# Patient Record
Sex: Male | Born: 1990 | Hispanic: Yes | Marital: Single | Smoking: Never smoker
Health system: Southern US, Community
[De-identification: ages and names within clinical notes are randomized; demographics above are authoritative.]

## PROBLEM LIST (undated history)

## (undated) ENCOUNTER — Ambulatory Visit: Admission: EM | Payer: Self-pay | Source: Home / Self Care

## (undated) DIAGNOSIS — E119 Type 2 diabetes mellitus without complications: Secondary | ICD-10-CM

## (undated) HISTORY — DX: Type 2 diabetes mellitus without complications: E11.9

---
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
## 2013-03-08 ENCOUNTER — Encounter (HOSPITAL_COMMUNITY)
Admission: EM | Disposition: A | Discharge status: Home / Self Care | Payer: Self-pay | Source: Home / Self Care | Attending: Emergency Medicine

## 2013-03-08 ENCOUNTER — Observation Stay (HOSPITAL_BASED_OUTPATIENT_CLINIC_OR_DEPARTMENT_OTHER)
Admission: EM | Admit: 2013-03-08 | Discharge: 2013-03-10 | Disposition: A | Discharge status: Home / Self Care | Payer: Self-pay | Attending: General Surgery | Admitting: General Surgery

## 2013-03-08 ENCOUNTER — Emergency Department (HOSPITAL_BASED_OUTPATIENT_CLINIC_OR_DEPARTMENT_OTHER): Payer: Self-pay

## 2013-03-08 ENCOUNTER — Encounter (HOSPITAL_BASED_OUTPATIENT_CLINIC_OR_DEPARTMENT_OTHER): Payer: Self-pay | Admitting: *Deleted

## 2013-03-08 DIAGNOSIS — K3580 Unspecified acute appendicitis: Secondary | ICD-10-CM

## 2013-03-08 DIAGNOSIS — D72829 Elevated white blood cell count, unspecified: Secondary | ICD-10-CM | POA: Insufficient documentation

## 2013-03-08 DIAGNOSIS — R1031 Right lower quadrant pain: Secondary | ICD-10-CM | POA: Insufficient documentation

## 2013-03-08 HISTORY — PX: LAPAROSCOPIC APPENDECTOMY: SHX408

## 2013-03-08 LAB — URINALYSIS, ROUTINE W REFLEX MICROSCOPIC
Ketones, ur: >80 mg/dL — AB
Leukocytes, UA: NEGATIVE
Nitrite: NEGATIVE
Protein, ur: 30 mg/dL — AB
Urobilinogen, UA: 0.2 mg/dL (ref 0.0–1.0)
pH: 8.5 — ABNORMAL HIGH (ref 5.0–8.0)

## 2013-03-08 LAB — COMPREHENSIVE METABOLIC PANEL
AST: 15 U/L (ref 0–37)
Albumin: 4.8 g/dL (ref 3.5–5.2)
Alkaline Phosphatase: 74 U/L (ref 39–117)
Chloride: 100 mEq/L (ref 96–112)
Potassium: 3.6 mEq/L (ref 3.5–5.1)
Total Bilirubin: 0.8 mg/dL (ref 0.3–1.2)
Total Protein: 8 g/dL (ref 6.0–8.3)

## 2013-03-08 LAB — CBC WITH DIFFERENTIAL/PLATELET
Basophils Absolute: 0 10*3/uL (ref 0.0–0.1)
Basophils Relative: 0 % (ref 0–1)
MCHC: 35 g/dL (ref 30.0–36.0)
Neutro Abs: 18 10*3/uL — ABNORMAL HIGH (ref 1.7–7.7)
Neutrophils Relative %: 87 % — ABNORMAL HIGH (ref 43–77)
Platelets: 305 10*3/uL (ref 150–400)
RDW: 13.5 % (ref 11.5–15.5)

## 2013-03-08 LAB — URINE MICROSCOPIC-ADD ON

## 2013-03-08 SURGERY — APPENDECTOMY, LAPAROSCOPIC
Anesthesia: General | Wound class: Contaminated

## 2013-03-08 MED ORDER — MORPHINE SULFATE 4 MG/ML IJ SOLN
4.0000 mg | Freq: Once | INTRAMUSCULAR | Status: AC
  Administered 2013-03-08: 4 mg via INTRAVENOUS
  Filled 2013-03-08: qty 1

## 2013-03-08 MED ORDER — SODIUM CHLORIDE 0.9 % IV SOLN
1.0000 g | Freq: Once | INTRAVENOUS | Status: AC
  Administered 2013-03-08: 1 g via INTRAVENOUS
  Filled 2013-03-08: qty 1

## 2013-03-08 MED ORDER — ONDANSETRON HCL 4 MG/2ML IJ SOLN
4.0000 mg | Freq: Once | INTRAMUSCULAR | Status: AC
  Administered 2013-03-08: 4 mg via INTRAVENOUS
  Filled 2013-03-08: qty 2

## 2013-03-08 MED ORDER — MORPHINE SULFATE 4 MG/ML IJ SOLN: 4.0000 mg | INTRAMUSCULAR | Status: DC | PRN

## 2013-03-08 MED ORDER — IOHEXOL 300 MG/ML  SOLN
100.0000 mL | Freq: Once | INTRAMUSCULAR | Status: AC | PRN
  Administered 2013-03-08: 100 mL via INTRAVENOUS

## 2013-03-08 MED ORDER — SODIUM CHLORIDE 0.9 % IV SOLN
INTRAVENOUS | Status: AC
  Filled 2013-03-08: qty 50

## 2013-03-08 MED ORDER — LACTATED RINGERS IV SOLN
INTRAVENOUS | Status: DC
  Administered 2013-03-08 – 2013-03-09 (×2): via INTRAVENOUS

## 2013-03-08 MED ORDER — IOHEXOL 300 MG/ML  SOLN
50.0000 mL | Freq: Once | INTRAMUSCULAR | Status: AC | PRN
  Administered 2013-03-08: 50 mL via ORAL

## 2013-03-08 SURGICAL SUPPLY — 40 items
APPLIER CLIP ROT 10 11.4 M/L (STAPLE)
CANISTER SUCTION 2500CC (MISCELLANEOUS) IMPLANT
CHLORAPREP W/TINT 26ML (MISCELLANEOUS) ×2 IMPLANT
CLIP APPLIE ROT 10 11.4 M/L (STAPLE) IMPLANT
CLOTH BEACON ORANGE TIMEOUT ST (SAFETY) ×2 IMPLANT
COVER SURGICAL LIGHT HANDLE (MISCELLANEOUS) ×2 IMPLANT
CUTTER FLEX LINEAR 45M (STAPLE) ×2 IMPLANT
DECANTER SPIKE VIAL GLASS SM (MISCELLANEOUS) ×6 IMPLANT
DERMABOND ADVANCED (GAUZE/BANDAGES/DRESSINGS) ×1
DERMABOND ADVANCED .7 DNX12 (GAUZE/BANDAGES/DRESSINGS) ×1 IMPLANT
ELECT REM PT RETURN 9FT ADLT (ELECTROSURGICAL) ×2
ELECTRODE REM PT RTRN 9FT ADLT (ELECTROSURGICAL) ×1 IMPLANT
GLOVE SURG SS PI 7.5 STRL IVOR (GLOVE) ×4 IMPLANT
GLOVE SURG SS PI 8.5 STRL IVOR (GLOVE) ×2
GLOVE SURG SS PI 8.5 STRL STRW (GLOVE) ×2 IMPLANT
GOWN STRL NON-REIN LRG LVL3 (GOWN DISPOSABLE) ×4 IMPLANT
GOWN STRL REIN 3XL XLG LVL4 (GOWN DISPOSABLE) ×2 IMPLANT
GOWN STRL REIN XL XLG (GOWN DISPOSABLE) ×2 IMPLANT
HANDLE STAPLE EGIA 4 XL (STAPLE) ×2 IMPLANT
HANDLE STAPLE ENDO GIA SHORT (STAPLE) ×1
KIT BASIN OR (CUSTOM PROCEDURE TRAY) ×2 IMPLANT
NS IRRIG 1000ML POUR BTL (IV SOLUTION) ×2 IMPLANT
POUCH SPECIMEN RETRIEVAL 10MM (ENDOMECHANICALS) ×2 IMPLANT
RELOAD 45 VASCULAR/THIN (ENDOMECHANICALS) IMPLANT
RELOAD EGIA 45 MED/THCK PURPLE (STAPLE) ×2 IMPLANT
RELOAD EGIA 45 TAN VASC (STAPLE) ×2 IMPLANT
RELOAD STAPLE TA45 3.5 REG BLU (ENDOMECHANICALS) IMPLANT
SCALPEL HARMONIC ACE (MISCELLANEOUS) ×2 IMPLANT
SCISSORS LAP 5X35 DISP (ENDOMECHANICALS) IMPLANT
SET IRRIG TUBING LAPAROSCOPIC (IRRIGATION / IRRIGATOR) ×2 IMPLANT
SLEEVE ENDOPATH XCEL 5M (ENDOMECHANICALS) ×2 IMPLANT
STAPLER ENDO GIA 12MM SHORT (STAPLE) ×1 IMPLANT
SUT MNCRL AB 4-0 PS2 18 (SUTURE) ×2 IMPLANT
SUT VICRYL 0 UR6 27IN ABS (SUTURE) ×2 IMPLANT
TOWEL OR 17X24 6PK STRL BLUE (TOWEL DISPOSABLE) ×2 IMPLANT
TOWEL OR 17X26 10 PK STRL BLUE (TOWEL DISPOSABLE) ×2 IMPLANT
TRAY FOLEY CATH 14FR (SET/KITS/TRAYS/PACK) ×2 IMPLANT
TROCAR XCEL BLUNT TIP 100MML (ENDOMECHANICALS) ×2 IMPLANT
TROCAR XCEL NON-BLD 5MMX100MML (ENDOMECHANICALS) ×2 IMPLANT
WATER STERILE IRR 1000ML POUR (IV SOLUTION) IMPLANT

## 2013-03-08 NOTE — ED Notes (Signed)
Pt states he was awakened at 0500 with RLQ pain. +vomiting. Rebound tenderness. Temp 99.2 at triage.

## 2013-03-08 NOTE — H&P (Signed)
Reason for Consult:appendicitis Referring Physician: Dr. [name] is an 22 y.o. male.  HPI: He awoke this am with periumbilical pain which has progressed throughout the day and has now localized to the RLQ.  He has had some nausea and 2 episodes of vomiting.  +chills but no fevers.  He denies any abnormalities with his urine or bowels.  He was transferred [facility] for appendectomy  History reviewed. No pertinent past medical history.  History reviewed. No pertinent past surgical history.  History reviewed. No pertinent family history.  Social History:  reports that he has never smoked. He does not have any smokeless tobacco history on file. He reports that he does not drink alcohol or use illicit drugs.  Allergies: No Known Allergies  Medications: I have reviewed the patient's current medications.  Results for orders placed during the hospital encounter of 03/08/13 (from the past 48 hour(s))  URINALYSIS, ROUTINE W REFLEX MICROSCOPIC     Status: Abnormal   Collection Time    03/08/13  4:16 PM      Result Value Range   Color, Urine YELLOW  YELLOW   APPearance CLOUDY (*) CLEAR   Specific Gravity, Urine 1.028  1.005 - 1.030   pH 8.5 (*) 5.0 - 8.0   Glucose, UA NEGATIVE  NEGATIVE mg/dL   Hgb urine dipstick NEGATIVE  NEGATIVE   Bilirubin Urine NEGATIVE  NEGATIVE   Ketones, ur >80 (*) NEGATIVE mg/dL   Protein, ur 30 (*) NEGATIVE mg/dL   Urobilinogen, UA 0.2  0.0 - 1.0 mg/dL   Nitrite NEGATIVE  NEGATIVE   Leukocytes, UA NEGATIVE  NEGATIVE  URINE MICROSCOPIC-ADD ON     Status: None   Collection Time    03/08/13  4:16 PM      Result Value Range   Squamous Epithelial / LPF RARE  RARE   WBC, UA 0-2  <3 WBC/hpf   RBC / HPF 0-2  <3 RBC/hpf   Bacteria, UA RARE  RARE   Urine-Other MUCOUS PRESENT     Comment: AMORPHOUS URATES/PHOSPHATES  CBC WITH DIFFERENTIAL     Status: Abnormal   Collection Time    03/08/13  4:34 PM      Result Value Range   WBC 20.7 (*) 4.0 - 10.5 K/uL   RBC 5.49  4.22 - 5.81 MIL/uL   Hemoglobin 15.8  13.0 - 17.0 g/dL   HCT 19.1  47.8 - 29.5 %   MCV 82.3  78.0 - 100.0 fL   MCH 28.8  26.0 - 34.0 pg   MCHC 35.0  30.0 - 36.0 g/dL   RDW 62.1  30.8 - 65.7 %   Platelets 305  150 - 400 K/uL   Neutrophils Relative 87 (*) 43 - 77 %   Neutro Abs 18.0 (*) 1.7 - 7.7 K/uL   Lymphocytes Relative 4 (*) 12 - 46 %   Lymphs Abs 0.8  0.7 - 4.0 K/uL   Monocytes Relative 9  3 - 12 %   Monocytes Absolute 1.9 (*) 0.1 - 1.0 K/uL   Eosinophils Relative 0  0 - 5 %   Eosinophils Absolute 0.0  0.0 - 0.7 K/uL   Basophils Relative 0  0 - 1 %   Basophils Absolute 0.0  0.0 - 0.1 K/uL  COMPREHENSIVE METABOLIC PANEL     Status: Abnormal   Collection Time    03/08/13  4:34 PM      Result Value Range   Sodium 140  135 - 145 mEq/L   Potassium 3.6  3.5 - 5.1 mEq/L   Chloride 100  96 - 112 mEq/L   CO2 26  19 - 32 mEq/L   Glucose, Bld 111 (*) 70 - 99 mg/dL   BUN 9  6 - 23 mg/dL   Creatinine, Ser 1.61  0.50 - 1.35 mg/dL   Calcium 09.6  8.4 - 04.5 mg/dL   Total Protein 8.0  6.0 - 8.3 g/dL   Albumin 4.8  3.5 - 5.2 g/dL   AST 15  0 - 37 U/L   ALT 14  0 - 53 U/L   Alkaline Phosphatase 74  39 - 117 U/L   Total Bilirubin 0.8  0.3 - 1.2 mg/dL   GFR calc non Af Amer >90  >90 mL/min   GFR calc Af Amer >90  >90 mL/min   Comment:            The eGFR has been calculated     using the CKD EPI equation.     This calculation has not been     validated in all clinical     situations.     eGFR's persistently     <90 mL/min signify     possible Chronic Kidney Disease.    Ct Abdomen Pelvis W Contrast  03/08/2013  *RADIOLOGY REPORT*  Clinical Data: Right lower quadrant pain, rebound tenderness, vomiting  CT ABDOMEN AND PELVIS WITH CONTRAST  Technique:  Multidetector CT imaging of the abdomen and pelvis was performed following the standard protocol during bolus administration of intravenous contrast.  Contrast: 50mL OMNIPAQUE IOHEXOL 300 MG/ML   SOLN, OMNIPAQUE IOHEXOL 300 MG/ML  SOLN  Comparison: None.  Findings: Lung bases are unremarkable.  Sagittal images of the spine are unremarkable.  Liver, spleen, pancreas and adrenals are unremarkable.  Kidneys are symmetrical in size and enhancement.  No hydronephrosis or hydroureter.  No small bowel obstruction.  No ascites or free air. No adenopathy.  There is abnormal distention of the appendix with fluid in the right lower quadrant.  This is best visualized in axial image 52. A calcified appendicolith is noted at the base of the appendix measures about 6 mm.  The appendix measures 1.2 cm in diameter with mild enhancement of the wall.  Findings are consistent with acute appendicitis.  The appendix is best visualized in the sagittal image 31.  Prostate gland seminal vesicles are unremarkable.  Urinary bladder is unremarkable.  IMPRESSION:  1.  Abnormal enlargement of the appendix with fluid and mild enhancement of the wall.  There is a calcified appendicolith at the appendix base.  Findings are consistent with acute appendicitis. 2.  No hydronephrosis or hydroureter. 3.  No small bowel obstruction.  Critical findings discussed with Dr. Judd Love.   Original Report Authenticated By: Natasha Mead, M.D.     All other review of systems negative or noncontributory except as stated in the HPI   Blood pressure 129/69, pulse 96, temperature 100.5 F (38.1 C), temperature source Oral, resp. rate 20, height 5\' 8"  (1.727 m), weight 174 lb (78.926 kg), SpO2 98.00%. General appearance: alert, cooperative and no distress Head: Normocephalic, without obvious abnormality, atraumatic Neck: no JVD and supple, symmetrical, trachea midline Resp: clear to auscultation bilaterally Cardio: normal rate, regular GI: soft, focal RLQ tenderness, ND, no other tenderness or peritoneal signs Extremities: extremities normal, atraumatic, no cyanosis or edema Pulses: 2+ and symmetric Skin: Skin color, texture, turgor normal. No  rashes or lesions  Assessment/Plan:  Appendicitis He has a history and exam which is very typical for acute appendicitis.  He has a CT scan concerning for this with elevated WBC and I have recommended appendectomy.  I discussed with him the risks of infection, bleeding, pain, scarring, leak, injury to surrounding structures, abscess, and need for open surgery or negative laparoscopy discussed and he would like to proceed with dx. Laparoscopy and appendectomy.  Cory Love 03/08/2013, 10:31 PM

## 2013-03-08 NOTE — Anesthesia Preprocedure Evaluation (Addendum)
Anesthesia Evaluation  Patient identified by MRN, date of birth, ID band Patient awake    Reviewed: Allergy & Precautions, H&P , NPO status , Patient's Chart, lab work & pertinent test results  Airway Mallampati: II TM Distance: >3 FB Neck ROM: full    Dental no notable dental hx. (+) Teeth Intact and Dental Advisory Given   Pulmonary neg pulmonary ROS,  breath sounds clear to auscultation  Pulmonary exam normal       Cardiovascular Exercise Tolerance: Good negative cardio ROS  Rhythm:regular Rate:Normal     Neuro/Psych negative neurological ROS  negative psych ROS   GI/Hepatic negative GI ROS, Neg liver ROS,   Endo/Other  negative endocrine ROS  Renal/GU negative Renal ROS  negative genitourinary   Musculoskeletal   Abdominal   Peds  Hematology negative hematology ROS (+)   Anesthesia Other Findings   Reproductive/Obstetrics negative OB ROS                           Anesthesia Physical Anesthesia Plan  ASA: I and emergent  Anesthesia Plan: General   Post-op Pain Management:    Induction: Intravenous, Rapid sequence and Cricoid pressure planned  Airway Management Planned: Oral ETT  Additional Equipment:   Intra-op Plan:   Post-operative Plan: Extubation in OR  Informed Consent: I have reviewed the patients History and Physical, chart, labs and discussed the procedure including the risks, benefits and alternatives for the proposed anesthesia with the patient or authorized representative who has indicated his/her understanding and acceptance.   Dental Advisory Given  Plan Discussed with: CRNA and Surgeon  Anesthesia Plan Comments:         Anesthesia Quick Evaluation  

## 2013-03-08 NOTE — ED Notes (Signed)
[facility] at bedside preparing pt for transport ?

## 2013-03-08 NOTE — ED Notes (Signed)
[id number]<GM> Expected date:<BR> Expected time:<BR> Means of arrival:<BR> Comments:<BR> Nivaan Dicenzo from [facility]

## 2013-03-08 NOTE — ED Provider Notes (Signed)
History    This chart was scribed for Cory Lyons, MD scribed by Magnus Sinning. The patient was seen in room MH07/MH07 at 16:47   CSN: [id number]  Arrival date & time 03/08/13  1559     Chief Complaint  Patient presents with  . Abdominal Pain    (Consider location/radiation/quality/duration/timing/severity/associated sxs/prior treatment) Patient is a 22 y.o. male presenting with abdominal pain. The history is provided by the patient. No language interpreter was used.  Abdominal Pain Pain location:  RLQ Pain radiates to:  Does not radiate Pain severity:  Moderate Duration:  12 hours Timing:  Constant Progression:  Worsening Chronicity:  New Context: awakening from sleep   Associated symptoms: fever and vomiting    Cory Love is a 22 y.o. male who presents to the Emergency Department complaining of constant moderate RLQ pain with associated emesis and fever that woke him up today approximately 12 hours ago. He says he went somewhere for eval, but says the abd pain improved and thus he left. He says the pain returned and it began worsening throughout day. Patient states he is healthy otherwise and denies any dysuria. However, he notes that  when he moves he notices that the back of his testes hurt mildly.  History reviewed. No pertinent past medical history.  History reviewed. No pertinent past surgical history.  History reviewed. No pertinent family history.  History  Substance Use Topics  . Smoking status: Never Smoker   . Smokeless tobacco: Not on file  . Alcohol Use: No      Review of Systems  Constitutional: Positive for fever.  Gastrointestinal: Positive for vomiting and abdominal pain.  All other systems reviewed and are negative.    Allergies  Review of patient's allergies indicates no known allergies.  Home Medications  No current outpatient prescriptions on file.  BP 119/72  Pulse 86  Temp(Src) 99.2 F (37.3 C) (Oral)  Resp 18  Ht 5'  8" (1.727 m)  Wt 174 lb (78.926 kg)  BMI 26.46 kg/m2  SpO2 100%  Physical Exam  Nursing note and vitals reviewed. Constitutional: He is oriented to person, place, and time. He appears well-developed and well-nourished. No distress.  HENT:  Head: Normocephalic and atraumatic.  Eyes: Conjunctivae and EOM are normal.  Neck: Neck supple. No tracheal deviation present.  Cardiovascular: Normal rate and regular rhythm.   No murmur heard. Pulmonary/Chest: Effort normal. No respiratory distress. He has no wheezes. He has no rales.  Abdominal: Soft. Bowel sounds are normal. He exhibits no distension. There is tenderness. There is no rebound and no guarding.  There is TTP in the RLQ with no rebound or guarding.   Musculoskeletal: Normal range of motion.  Neurological: He is alert and oriented to person, place, and time. No sensory deficit.  Skin: Skin is dry.  Psychiatric: He has a normal mood and affect. His behavior is normal.    ED Course  Procedures (including critical care time) DIAGNOSTIC STUDIES: Oxygen Saturation is 100% on room air, normal by my interpretation.    COORDINATION OF CARE: 16:48: Physical exam performed.   Labs Reviewed  URINALYSIS, ROUTINE W REFLEX MICROSCOPIC - Abnormal; Notable for the following:    APPearance CLOUDY (*)    pH 8.5 (*)    Ketones, ur >80 (*)    Protein, ur 30 (*)    All other components within normal limits  CBC WITH DIFFERENTIAL - Abnormal; Notable for the following:    WBC 20.7 (*)  Neutrophils Relative 87 (*)    Neutro Abs 18.0 (*)    Lymphocytes Relative 4 (*)    Monocytes Absolute 1.9 (*)    All other components within normal limits  COMPREHENSIVE METABOLIC PANEL - Abnormal; Notable for the following:    Glucose, Bld 111 (*)    All other components within normal limits  URINE MICROSCOPIC-ADD ON   No results found.   No diagnosis found.    MDM  The wbc is 21k, the ct confirms acute appendicitis.  I have ordered invanz and  have spoken with Dr. Biagio Quint at [facility] who agrees to accept the patient in transfer.  Pain medications offered, he has declined at this point.      .I personally performed the services described in this documentation, which was scribed in my presence. The recorded information has been reviewed and is accurate.           Cory Lyons, MD 03/08/13 [phone]

## 2013-03-09 ENCOUNTER — Encounter (HOSPITAL_COMMUNITY): Payer: Self-pay | Admitting: Anesthesiology

## 2013-03-09 ENCOUNTER — Emergency Department (HOSPITAL_COMMUNITY): Payer: Self-pay | Admitting: Anesthesiology

## 2013-03-09 MED ORDER — FENTANYL CITRATE 0.05 MG/ML IJ SOLN
INTRAMUSCULAR | Status: DC | PRN
  Administered 2013-03-09 (×2): 100 ug via INTRAVENOUS
  Administered 2013-03-09: 50 ug via INTRAVENOUS

## 2013-03-09 MED ORDER — BUPIVACAINE HCL 0.25 % IJ SOLN
INTRAMUSCULAR | Status: DC | PRN
  Administered 2013-03-09: 30 mL

## 2013-03-09 MED ORDER — ACETAMINOPHEN 10 MG/ML IV SOLN
INTRAVENOUS | Status: DC | PRN
  Administered 2013-03-09: 1000 mg via INTRAVENOUS

## 2013-03-09 MED ORDER — GLYCOPYRROLATE 0.2 MG/ML IJ SOLN
INTRAMUSCULAR | Status: DC | PRN
  Administered 2013-03-09: 0.6 mg via INTRAVENOUS

## 2013-03-09 MED ORDER — MORPHINE SULFATE 2 MG/ML IJ SOLN: 2.0000 mg | INTRAMUSCULAR | Status: DC | PRN

## 2013-03-09 MED ORDER — SODIUM CHLORIDE 0.9 % IV SOLN
1.0000 g | INTRAVENOUS | Status: AC
  Administered 2013-03-09: 1 g via INTRAVENOUS
  Filled 2013-03-09: qty 1

## 2013-03-09 MED ORDER — BUPIVACAINE HCL (PF) 0.25 % IJ SOLN
INTRAMUSCULAR | Status: AC
  Filled 2013-03-09: qty 30

## 2013-03-09 MED ORDER — LIDOCAINE-EPINEPHRINE 1 %-1:100000 IJ SOLN
INTRAMUSCULAR | Status: AC
  Filled 2013-03-09: qty 1

## 2013-03-09 MED ORDER — PROPOFOL 10 MG/ML IV BOLUS
INTRAVENOUS | Status: DC | PRN
  Administered 2013-03-09: 200 mg via INTRAVENOUS

## 2013-03-09 MED ORDER — ONDANSETRON HCL 4 MG/2ML IJ SOLN
INTRAMUSCULAR | Status: DC | PRN
  Administered 2013-03-09: 4 mg via INTRAVENOUS

## 2013-03-09 MED ORDER — ACETAMINOPHEN 10 MG/ML IV SOLN
INTRAVENOUS | Status: AC
  Filled 2013-03-09: qty 100

## 2013-03-09 MED ORDER — HYDROCODONE-ACETAMINOPHEN 5-325 MG PO TABS
1.0000 | ORAL_TABLET | ORAL | Status: DC | PRN
  Administered 2013-03-09: 1 via ORAL
  Filled 2013-03-09: qty 1

## 2013-03-09 MED ORDER — ROCURONIUM BROMIDE 100 MG/10ML IV SOLN
INTRAVENOUS | Status: DC | PRN
  Administered 2013-03-09: 30 mg via INTRAVENOUS
  Administered 2013-03-09: 10 mg via INTRAVENOUS

## 2013-03-09 MED ORDER — FENTANYL CITRATE 0.05 MG/ML IJ SOLN: 25.0000 ug | INTRAMUSCULAR | Status: DC | PRN

## 2013-03-09 MED ORDER — SUCCINYLCHOLINE CHLORIDE 20 MG/ML IJ SOLN
INTRAMUSCULAR | Status: DC | PRN
  Administered 2013-03-09: 140 mg via INTRAVENOUS

## 2013-03-09 MED ORDER — ONDANSETRON HCL 4 MG/2ML IJ SOLN: 4.0000 mg | Freq: Four times a day (QID) | INTRAMUSCULAR | Status: DC | PRN

## 2013-03-09 MED ORDER — ENOXAPARIN SODIUM 40 MG/0.4ML ~~LOC~~ SOLN
40.0000 mg | SUBCUTANEOUS | Status: DC
  Administered 2013-03-09: 40 mg via SUBCUTANEOUS
  Filled 2013-03-09 (×2): qty 0.4

## 2013-03-09 MED ORDER — LACTATED RINGERS IV SOLN
INTRAVENOUS | Status: DC
Start: 2013-03-09 — End: 2013-03-09

## 2013-03-09 MED ORDER — LIDOCAINE-EPINEPHRINE 1 %-1:100000 IJ SOLN
INTRAMUSCULAR | Status: DC | PRN
  Administered 2013-03-09: 30 mL

## 2013-03-09 MED ORDER — ONDANSETRON HCL 4 MG PO TABS: 4.0000 mg | ORAL_TABLET | Freq: Four times a day (QID) | ORAL | Status: DC | PRN

## 2013-03-09 MED ORDER — ACETAMINOPHEN 325 MG PO TABS: 650.0000 mg | ORAL_TABLET | Freq: Four times a day (QID) | ORAL | Status: DC | PRN

## 2013-03-09 MED ORDER — 0.9 % SODIUM CHLORIDE (POUR BTL) OPTIME
TOPICAL | Status: DC | PRN
  Administered 2013-03-09: 1000 mL

## 2013-03-09 MED ORDER — HYDROMORPHONE HCL PF 1 MG/ML IJ SOLN
INTRAMUSCULAR | Status: DC | PRN
  Administered 2013-03-09 (×2): 1 mg via INTRAVENOUS

## 2013-03-09 MED ORDER — LACTATED RINGERS IV SOLN
INTRAVENOUS | Status: DC
  Administered 2013-03-09 (×2): via INTRAVENOUS

## 2013-03-09 MED ORDER — NEOSTIGMINE METHYLSULFATE 1 MG/ML IJ SOLN
INTRAMUSCULAR | Status: DC | PRN
  Administered 2013-03-09: 4 mg via INTRAVENOUS

## 2013-03-09 NOTE — Care Management Utilization Note (Signed)
UR completed 

## 2013-03-09 NOTE — Transfer of Care (Signed)
Immediate Anesthesia Transfer of Care Note  Patient: Cory Love  Procedure(s) Performed: Procedure(s): APPENDECTOMY LAPAROSCOPIC (N/A)  Patient Location: PACU  Anesthesia Type:General  Level of Consciousness: awake, oriented, patient cooperative and responds to stimulation  Airway & Oxygen Therapy: Patient Spontanous Breathing and Patient connected to face mask oxygen  Post-op Assessment: Report given to PACU RN and Post -op Vital signs reviewed and stable  Post vital signs: Reviewed and stable  Complications: No apparent anesthesia complications

## 2013-03-09 NOTE — Brief Op Note (Signed)
03/08/2013 - 03/09/2013  2:30 AM  PATIENT:  Cory Love  22 y.o. male  PRE-OPERATIVE DIAGNOSIS:  acute appendicitis  POST-OPERATIVE DIAGNOSIS:  Abdominal pain acute appendicitis  PROCEDURE:  Procedure(s): APPENDECTOMY LAPAROSCOPIC (N/A)  SURGEON:  Surgeon(s) and Role:    * Lodema Pilot, DO - Primary  PHYSICIAN ASSISTANT:   ASSISTANTS: none   ANESTHESIA:   general  EBL:  Total I/O In: 200 [I.V.:200] Out: 305 [Urine:300; Blood:5]  BLOOD ADMINISTERED:none  DRAINS: none   LOCAL MEDICATIONS USED:  MARCAINE    and LIDOCAINE   SPECIMEN:  Source of Specimen:  appendix  DISPOSITION OF SPECIMEN:  PATHOLOGY  COUNTS:  YES  TOURNIQUET:  * No tourniquets in log *  DICTATION: .Other Dictation: Dictation Number [id number]  PLAN OF CARE: Admit to inpatient   PATIENT DISPOSITION:  PACU - hemodynamically stable.   Delay start of Pharmacological VTE agent (>24hrs) due to surgical blood loss or risk of bleeding: no

## 2013-03-09 NOTE — Preoperative (Signed)
Beta Blockers   Reason not to administer Beta Blockers:Not Applicable 

## 2013-03-09 NOTE — Progress Notes (Signed)
Patient ID: Cory Love, male   DOB: 1991-04-28, 22 y.o.   MRN: [id number] 1 Day Post-Op  Subjective: Feels better than before surgery, mild RLQ pain.  Tol CL but cause some RLQ pain after  Objective: Vital signs in last 24 hours: Temp:  [98.3 F (36.8 C)-101 F (38.3 C)] 98.3 F (36.8 C) (03/23 0529) Pulse Rate:  [83-117] 107 (03/23 0529) Resp:  [16-25] 21 (03/23 0315) BP: (103-129)/(44-72) 109/50 mmHg (03/23 0529) SpO2:  [96 %-100 %] 98 % (03/23 0529) Weight:  [174 lb (78.926 kg)-179 lb 14.3 oz (81.6 kg)] 179 lb 14.3 oz (81.6 kg) (03/23 0342) Last BM Date: 03/08/13  Intake/Output from previous day: 03/22 0701 - 03/23 0700 In: 1050 [I.V.:1050] Out: 305 [Urine:300; Blood:5] Intake/Output this shift: Total I/O In: -  Out: 250 [Urine:250]  General appearance: alert, cooperative and no distress GI: abnormal findings:  mild tenderness in the RLQ Incision/Wound: No redness, drainage or swelling  Lab Results:   Recent Labs  03/08/13 1634  WBC 20.7*  HGB 15.8  HCT 45.2  PLT 305   BMET  Recent Labs  03/08/13 1634  NA 140  K 3.6  CL 100  CO2 26  GLUCOSE 111*  BUN 9  CREATININE 0.70  CALCIUM 10.0     Studies/Results: Ct Abdomen Pelvis W Contrast  03/08/2013  *RADIOLOGY REPORT*  Clinical Data: Right lower quadrant pain, rebound tenderness, vomiting  CT ABDOMEN AND PELVIS WITH CONTRAST  Technique:  Multidetector CT imaging of the abdomen and pelvis was performed following the standard protocol during bolus administration of intravenous contrast.  Contrast: 50mL OMNIPAQUE IOHEXOL 300 MG/ML  SOLN, OMNIPAQUE IOHEXOL 300 MG/ML  SOLN  Comparison: None.  Findings: Lung bases are unremarkable.  Sagittal images of the spine are unremarkable.  Liver, spleen, pancreas and adrenals are unremarkable.  Kidneys are symmetrical in size and enhancement.  No hydronephrosis or hydroureter.  No small bowel obstruction.  No ascites or free air. No adenopathy.  There is  abnormal distention of the appendix with fluid in the right lower quadrant.  This is best visualized in axial image 52. A calcified appendicolith is noted at the base of the appendix measures about 6 mm.  The appendix measures 1.2 cm in diameter with mild enhancement of the wall.  Findings are consistent with acute appendicitis.  The appendix is best visualized in the sagittal image 31.  Prostate gland seminal vesicles are unremarkable.  Urinary bladder is unremarkable.  IMPRESSION:  1.  Abnormal enlargement of the appendix with fluid and mild enhancement of the wall.  There is a calcified appendicolith at the appendix base.  Findings are consistent with acute appendicitis. 2.  No hydronephrosis or hydroureter. 3.  No small bowel obstruction.  Critical findings discussed with Dr. Judd Lien.   Original Report Authenticated By: Natasha Mead, M.D.     Anti-infectives: Anti-infectives   Start     Dose/Rate Route Frequency Ordered Stop   03/09/13 1900  ertapenem (INVANZ) 1 g in sodium chloride 0.9 % 50 mL IVPB     1 g 100 mL/hr over 30 Minutes Intravenous Every 24 hours 03/09/13 0352 03/10/13 1859   03/08/13 1830  ertapenem (INVANZ) 1 g in sodium chloride 0.9 % 50 mL IVPB     1 g 100 mL/hr over 30 Minutes Intravenous  Once 03/08/13 1821 03/08/13 1902      Assessment/Plan: s/p Procedure(s): APPENDECTOMY LAPAROSCOPIC Doing well post surgery Will continue IV abx today and check CBC in AM  LOS: 1 day    Gabryela Kimbrell T 03/09/2013

## 2013-03-09 NOTE — Anesthesia Postprocedure Evaluation (Signed)
  Anesthesia Post-op Note  Patient: Cory Love  Procedure(s) Performed: Procedure(s) (LRB): APPENDECTOMY LAPAROSCOPIC (N/A)  Patient Location: PACU  Anesthesia Type: General  Level of Consciousness: awake and alert   Airway and Oxygen Therapy: Patient Spontanous Breathing  Post-op Pain: mild  Post-op Assessment: Post-op Vital signs reviewed, Patient's Cardiovascular Status Stable, Respiratory Function Stable, Patent Airway and No signs of Nausea or vomiting  Last Vitals:  Filed Vitals:   03/09/13 0245  BP:   Pulse:   Temp: 37.8 C  Resp:     Post-op Vital Signs: stable   Complications: No apparent anesthesia complications

## 2013-03-09 NOTE — Op Note (Signed)
NAMEYOUSOF, ALDERMAN NO.:  [id number]  MEDICAL RECORD NO.:  [id number]  LOCATION:  [facility]                         FACILITY:  [facility]  PHYSICIAN:  Lodema Pilot, MD       DATE OF BIRTH:  05/11/1991  DATE OF PROCEDURE:  03/09/2013 DATE OF DISCHARGE:                              OPERATIVE REPORT   PROCEDURE:  Laparoscopic appendectomy.  PREOPERATIVE DIAGNOSIS:  Acute appendicitis.  POSTOPERATIVE DIAGNOSIS:  Acute appendicitis.  SURGEON:  Lodema Pilot, MD  ASSISTANT:  None.  ANESTHESIA:  General endotracheal anesthesia, 20 mL of 1% lidocaine with epinephrine and 0.25% Marcaine in a 50:50 mixture.  FLUIDS:  800 mL crystalloid.  ESTIMATED BLOOD LOSS:  Minimal.  DRAINS:  None.  SPECIMENS:  Appendix sent to Pathology for permanent sectioning.  COMPLICATIONS:  None apparent.  FINDINGS:  Fibrinous exudate on appendix with the necrotic appearing distal appendix with healthy-appearing base.  No evidence of perforation.  INDICATION FOR PROCEDURE:  Cory Love is a 22 year old male who was transferred from [facility] with history and physical exam consistent with acute appendicitis.  He had elevated white blood cell count and CT scan demonstrating acute appendicitis.  OPERATIVE DETAILS:  Cory Love was seen and evaluated in the emergency room and risks and benefits of procedure were discussed in lay terms. Informed consent was obtained.  He was given therapeutic antibiotics, taken to the operating room, placed on table in a supine position. General endotracheal anesthesia was obtained.  His left arm was tucked. Foley catheter was placed, and his abdomen was prepped and draped in the standard surgical fashion.  Procedure time-out was performed with all operative team members to confirm proper patient, procedure.  A supraumbilical midline incision was made in the skin and dissection carried down through the subcutaneous tissue using blunt  dissection. The fascia was elevated and sharply incised and the peritoneum was entered bluntly.  The 12-mm balloon port was placed at the umbilicus and pneumoperitoneum was obtained.  Laparoscope was introduced and a necrotic appearing appendix was stuck to the lateral sidewall and the terminal ileum was adherent to this.  I placed a 5 mm left lower quadrant trocar under direct visualization and a right upper quadrant trocar under direct visualization.  Then I easily peeled the small bowel away from the appendix and the omentum which was stuck to the appendix and he had some purple appearing necrotic appendix with fibrinous exudate and surrounding inflammatory change but no evidence of perforation.  The base appeared normal, but the appendix was twisted at almost 360 degrees on itself midway.  The appendix was easily untwisted. The appendix was elevated and a window was created through the mesoappendix near its base.  A purple Tri-Staple was used to transect the appendix at its base.  The staple line appeared hemostatic and staples were well formed.  A tan Tri-Staple was used to divide the mesoappendix just under the appendix taking care to avoid injury to the surrounding bowel contents and the ureter which was easily visualized through the peritoneum.  The staple line was hemostatic as well, and the appendix was placed in an EndoCatch bag and removed from the umbilical trocar site and sent to pathology for permanent section.  With  the staple lines hemostatic, I then approximated the umbilical fascia with interrupted 0 Vicryl sutures in open fashion.  The sutures were secured and the abdomen was re-insufflated with carbon dioxide gas and the abdominal wall closure was noted to be adequate without any evidence of bleeding or bowel injury.  The right lower quadrant appeared hemostatic and no evidence of staple line issues.  The final trocars were removed, and the wounds were injected with  20 mL of 1% lidocaine with epinephrine and 0.25% Marcaine in a 50:50 mixture.  The skin edges were approximated with 4-0 Monocryl subcuticular suture.  Skin was washed and dried, and Dermabond was applied.  Foley catheter was removed.  All sponge, needle, and instrument counts were correct at the end of the case.  The patient tolerated the procedure well without apparent complications.          ______________________________ Lodema Pilot, MD     BL/MEDQ  D:  03/09/2013  T:  03/09/2013  Job:  [id number]

## 2013-03-09 NOTE — Progress Notes (Signed)
pacu note : report given to [facility] as care giver.

## 2013-03-10 ENCOUNTER — Encounter (HOSPITAL_COMMUNITY): Payer: Self-pay | Admitting: General Surgery

## 2013-03-10 LAB — CBC
MCH: 28 pg (ref 26.0–34.0)
MCHC: 33.5 g/dL (ref 30.0–36.0)
MCV: 83.7 fL (ref 78.0–100.0)
Platelets: 214 10*3/uL (ref 150–400)
RBC: 4.96 MIL/uL (ref 4.22–5.81)

## 2013-03-10 MED ORDER — OXYCODONE HCL 5 MG PO TABS: 5.0000 mg | ORAL_TABLET | ORAL | Status: DC | PRN

## 2013-03-10 MED ORDER — AMOXICILLIN-POT CLAVULANATE 875-125 MG PO TABS: 1.0000 | ORAL_TABLET | Freq: Two times a day (BID) | ORAL | Status: DC

## 2013-03-10 NOTE — Progress Notes (Signed)
Pt ready for d/c. Went over d/c instructions, emphasized necessity of taking antibiotics, signs of infections and when to call the doctor. No change in pt condition since AM assessment. Removed PIV, WNL. Pt d/c'd to home with mother. Eugene Garnet RN

## 2013-03-10 NOTE — Discharge Instructions (Signed)
[facility] ______[facility] °LAPAROSCOPIC SURGERY: POST OP INSTRUCTIONS °Always review your discharge instruction sheet given to you by the facility where your surgery was performed. °IF YOU HAVE DISABILITY OR FAMILY LEAVE FORMS, YOU MUST BRING THEM TO THE OFFICE FOR PROCESSING.   °DO NOT GIVE THEM TO YOUR DOCTOR. ° °1. A prescription for pain medication may be given to you upon discharge.  Take your pain medication as prescribed, if needed.  If narcotic pain medicine is not needed, then you may take acetaminophen (Tylenol) or ibuprofen (Advil) as needed. °2. Take your usually prescribed medications unless otherwise directed. °3. If you need a refill on your pain medication, please contact your pharmacy.  They will contact our office to request authorization. Prescriptions will not be filled after 5pm or on week-ends. °4. You should follow a light diet the first few days after arrival home, such as soup and crackers, etc.  Be sure to include lots of fluids daily. °5. Most patients will experience some swelling and bruising in the area of the incisions.  Ice packs will help.  Swelling and bruising can take several days to resolve.  °6. It is common to experience some constipation if taking pain medication after surgery.  Increasing fluid intake and taking a stool softener (such as Colace) will usually help or prevent this problem from occurring.  A mild laxative (Milk of Magnesia or Miralax) should be taken according to package instructions if there are no bowel movements after 48 hours. °7. Unless discharge instructions indicate otherwise, you may remove your bandages 24-48 hours after surgery, and you may shower at that time.  You may have steri-strips (small skin tapes) in place directly over the incision.  These strips should be left on the skin for 7-10 days.  If your surgeon used skin glue on the incision, you may shower in 24 hours.  The glue will flake off over the next 2-3 weeks.  Any sutures or  staples will be removed at the office during your follow-up visit. °8. ACTIVITIES:  You may resume regular (light) daily activities beginning the next day--such as daily self-care, walking, climbing stairs--gradually increasing activities as tolerated.  You may have sexual intercourse when it is comfortable.  Refrain from any heavy lifting or straining until approved by your doctor. °a. You may drive when you are no longer taking prescription pain medication, you can comfortably wear a seatbelt, and you can safely maneuver your car and apply brakes. °b. RETURN TO WORK:  __________________________________________________________ °9. You should see your doctor in the office for a follow-up appointment approximately 2-3 weeks after your surgery.  Make sure that you call for this appointment within a day or two after you arrive home to insure a convenient appointment time. °10. OTHER INSTRUCTIONS: __________________________________________________________________________________________________________________________ __________________________________________________________________________________________________________________________ °WHEN TO CALL YOUR DOCTOR: °1. Fever over 101.0 °2. Inability to urinate °3. Continued bleeding from incision. °4. Increased pain, redness, or drainage from the incision. °5. Increasing abdominal pain ° °The clinic staff is available to answer your questions during regular business hours.  Please don’t hesitate to call and ask to speak to one of the nurses for clinical concerns.  If you have a medical emergency, go to the nearest emergency room or call 911.  A surgeon from [facility] is always on call at the hospital. °[address], [address], [place], [place]  [address] ? [address] [address], [place], [place]   [address] °[phone] ? [phone] ? FAX [phone] °Web site:   [email or web address] °

## 2013-03-10 NOTE — Progress Notes (Signed)
Patient ID: Velma Hanna, male   DOB: [birth year], 22 y.o.   MRN: [id number] 2 Days Post-Op  Subjective: Much better today, no pain with walking, got full quickly last night but denies n/v, +flatus and BM, feels ok about going home  Objective: Vital signs in last 24 hours: Temp:  [99.4 F (37.4 C)-100.9 F (38.3 C)] 99.4 F (37.4 C) (03/24 0538) Pulse Rate:  [96-102] 98 (03/24 0538) Resp:  [16-20] 20 (03/24 0538) BP: (114-116)/(51-66) 116/66 mmHg (03/24 0538) SpO2:  [98 %-100 %] 100 % (03/24 0538) Last BM Date: 03/08/13  Intake/Output from previous day: 03/23 0701 - 03/24 0700 In: -  Out: 1725 [Urine:1725] Intake/Output this shift: Total I/O In: 240 [P.O.:240] Out: -   PE: Abd: soft, mildly tender over incisions but otherwise appropriate, +BS, incisions c/d/i General: awake, alert, NAD  Lab Results:   Recent Labs  03/08/13 1634 03/10/13 0406  WBC 20.7* 8.3  HGB 15.8 13.9  HCT 45.2 41.5  PLT 305 214   BMET  Recent Labs  03/08/13 1634  NA 140  K 3.6  CL 100  CO2 26  GLUCOSE 111*  BUN 9  CREATININE 0.70  CALCIUM 10.0   PT/INR No results found for this basename: LABPROT, INR,  in the last 72 hours CMP     Component Value Date/Time   NA 140 03/08/2013 1634   K 3.6 03/08/2013 1634   CL 100 03/08/2013 1634   CO2 26 03/08/2013 1634   GLUCOSE 111* 03/08/2013 1634   BUN 9 03/08/2013 1634   CREATININE 0.70 03/08/2013 1634   CALCIUM 10.0 03/08/2013 1634   PROT 8.0 03/08/2013 1634   ALBUMIN 4.8 03/08/2013 1634   AST 15 03/08/2013 1634   ALT 14 03/08/2013 1634   ALKPHOS 74 03/08/2013 1634   BILITOT 0.8 03/08/2013 1634   GFRNONAA >90 03/08/2013 1634   GFRAA >90 03/08/2013 1634   Lipase  No results found for this basename: lipase       Studies/Results: Ct Abdomen Pelvis W Contrast  03/08/2013  *RADIOLOGY REPORT*  Clinical Data: Right lower quadrant pain, rebound tenderness, vomiting  CT ABDOMEN AND PELVIS WITH CONTRAST  Technique:  Multidetector CT  imaging of the abdomen and pelvis was performed following the standard protocol during bolus administration of intravenous contrast.  Contrast: 50mL OMNIPAQUE IOHEXOL 300 MG/ML  SOLN, OMNIPAQUE IOHEXOL 300 MG/ML  SOLN  Comparison: None.  Findings: Lung bases are unremarkable.  Sagittal images of the spine are unremarkable.  Liver, spleen, pancreas and adrenals are unremarkable.  Kidneys are symmetrical in size and enhancement.  No hydronephrosis or hydroureter.  No small bowel obstruction.  No ascites or free air. No adenopathy.  There is abnormal distention of the appendix with fluid in the right lower quadrant.  This is best visualized in axial image 52. A calcified appendicolith is noted at the base of the appendix measures about 6 mm.  The appendix measures 1.2 cm in diameter with mild enhancement of the wall.  Findings are consistent with acute appendicitis.  The appendix is best visualized in the sagittal image 31.  Prostate gland seminal vesicles are unremarkable.  Urinary bladder is unremarkable.  IMPRESSION:  1.  Abnormal enlargement of the appendix with fluid and mild enhancement of the wall.  There is a calcified appendicolith at the appendix base.  Findings are consistent with acute appendicitis. 2.  No hydronephrosis or hydroureter. 3.  No small bowel obstruction.  Critical findings discussed with Dr. Judd Lien.  Original Report Authenticated By: Natasha Mead, M.D.     Anti-infectives: Anti-infectives   Start     Dose/Rate Route Frequency Ordered Stop   03/10/13 0000  amoxicillin-clavulanate (AUGMENTIN) 875-125 MG per tablet     1 tablet Oral 2 times daily 03/10/13 1108     03/09/13 1900  ertapenem (INVANZ) 1 g in sodium chloride 0.9 % 50 mL IVPB     1 g 100 mL/hr over 30 Minutes Intravenous Every 24 hours 03/09/13 0352 03/09/13 1852   03/08/13 1830  ertapenem (INVANZ) 1 g in sodium chloride 0.9 % 50 mL IVPB     1 g 100 mL/hr over 30 Minutes Intravenous  Once 03/08/13 1821 03/08/13 1902        Assessment/Plan 1. POD#1-lap appy: doing well today, pain controlled, eating ok just in small amounts, will plan discharge after lunch today, will send home on augmentin an oxycodone, f/u in 2 weeks, call with questions or concerns.   LOS: 2 days    WHITE, ELIZABETH 03/10/2013

## 2013-03-10 NOTE — Progress Notes (Signed)
Cory Love. Corliss Skains, MD, [facility]  03/10/2013 1:28 PM

## 2013-03-10 NOTE — Discharge Summary (Signed)
Wilmon Arms. Corliss Skains, MD, [facility]  03/10/2013 1:29 PM

## 2013-03-10 NOTE — Discharge Summary (Signed)
  Physician Discharge Summary  Patient ID: Cory Love MRN: [id number] DOB/AGE: 09/15/1991 22 y.o.  Admit date: 03/08/2013 Discharge date: 03/10/2013  Admitting Diagnosis: Acute Appendicitis  Discharge Diagnosis Acute Appendicitis  Consultants None  Procedures Laparoscopic Appendectomy  Hospital Course 22 yr old male who presented to [facility] with abdominal pain.  Workup showed appendicitis.  Patient was admitted and underwent procedure listed above.  Tolerated procedure well and was transferred to the floor.  Diet was advanced as tolerated.  On POD#1, the patient was voiding well, tolerating diet, ambulating well, pain well controlled, vital signs stable, incisions c/d/i and felt stable for discharge home.  Patient will follow up in our office in 2 weeks and knows to call with questions or concerns.    Medication List    TAKE these medications       amoxicillin-clavulanate 875-125 MG per tablet  Commonly known as:  AUGMENTIN  Take 1 tablet by mouth 2 (two) times daily.     ibuprofen 200 MG tablet  Commonly known as:  ADVIL,MOTRIN  Take 400 mg by mouth every 6 (six) hours as needed for pain.     oxyCODONE 5 MG immediate release tablet  Commonly known as:  ROXICODONE  Take 1-2 tablets (5-10 mg total) by mouth every 4 (four) hours as needed for pain.             Follow-up Information   Follow up with [facility] In 2 weeks. (Our office will contact you with your follow up appt day and time)    Contact information:   [address]   [place] [address] [phone]       Signed: Denny Levy [facility] [phone]  03/10/2013, 11:09 AM

## 2013-04-10 ENCOUNTER — Ambulatory Visit (INDEPENDENT_AMBULATORY_CARE_PROVIDER_SITE_OTHER): Payer: Self-pay | Admitting: General Surgery

## 2013-04-10 ENCOUNTER — Encounter (INDEPENDENT_AMBULATORY_CARE_PROVIDER_SITE_OTHER): Payer: Self-pay | Admitting: General Surgery

## 2013-04-10 VITALS — BP 132/72 | HR 76 | Resp 16 | Ht 66.0 in | Wt 172.0 lb

## 2013-04-10 DIAGNOSIS — Z5189 Encounter for other specified aftercare: Secondary | ICD-10-CM

## 2013-04-10 DIAGNOSIS — Z4889 Encounter for other specified surgical aftercare: Secondary | ICD-10-CM

## 2013-04-10 NOTE — Progress Notes (Signed)
Subjective:     Patient ID: Cory Love, male   DOB: 03-Nov-1991, 22 y.o.   MRN: [id number]  HPI andthis patient follows up one month status post laparoscopic appendectomy for acute appendicitis. He has no complaints and has recovered completely from this procedure. He has been going to the gym and has no complaints. He says his bowels are functioning normally. His pathology was benign  Review of Systems     Objective:   Physical Exam No distress and nontoxic-appearing His abdomen is soft nontender and exam his incisions are healing nicely without sign of infection.    Assessment:     Status post colonoscopic appendectomy-doing well He has recovered nicely from this procedure and there is no evidence of any postoperative complications. He can gradually increase his activity as tolerated. Pathology is benign. He can follow up when necessary     Plan:     followup when necessary

## 2013-10-23 ENCOUNTER — Other Ambulatory Visit: Payer: Self-pay

## 2014-10-23 IMAGING — CT CT ABD-PELV W/ CM
2 of 4 series · 16 of 46 positions shown, 18 images · IV contrast (APPLIED)
Comparison: None.

CLINICAL DATA: Right lower quadrant pain, rebound tenderness,
vomiting

CT ABDOMEN AND PELVIS WITH CONTRAST
TECHNIQUE: Multidetector CT imaging of the abdomen and pelvis was
performed following the standard protocol during bolus
administration of intravenous contrast.
Contrast: 50mL OMNIPAQUE IOHEXOL 300 MG/ML  SOLN, 100mL OMNIPAQUE
IOHEXOL 300 MG/ML  SOLN

[Series 2: abd/pelvis 5.0 b31f · axial · 0.72mm/px · z∈[-558,-158]mm · 13 of 88 slices shown, 15 images]
[im 4/88  soft-tissue]
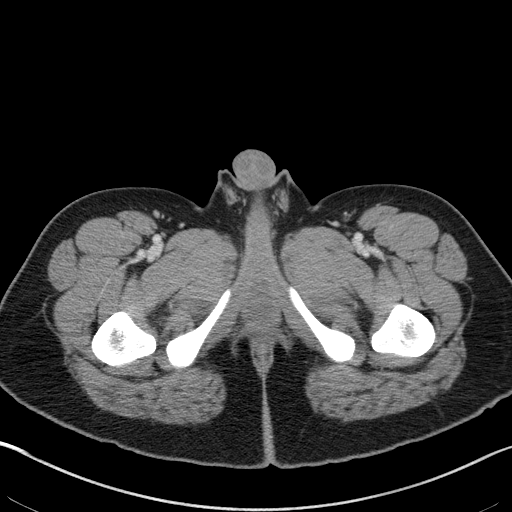
[im 4/88  bone]
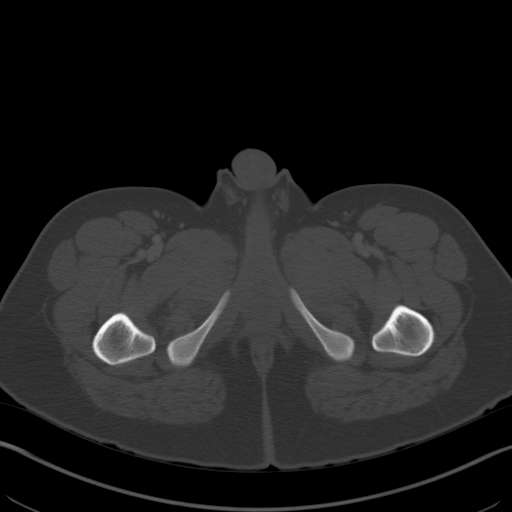
[im 11/88  soft-tissue]
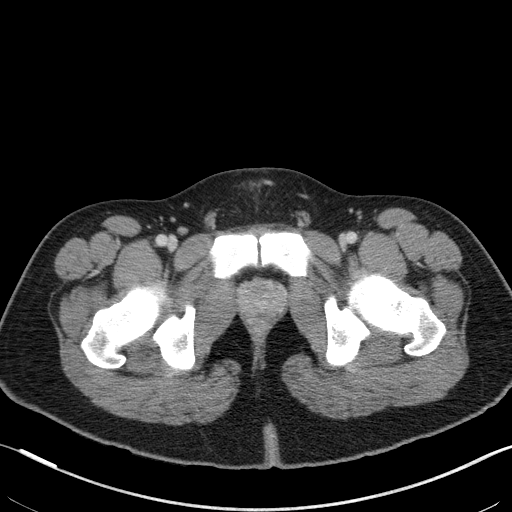
[im 18/88  soft-tissue]
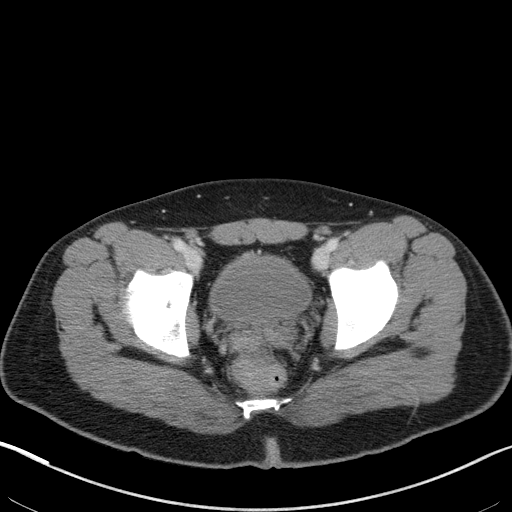
[im 25/88  soft-tissue]
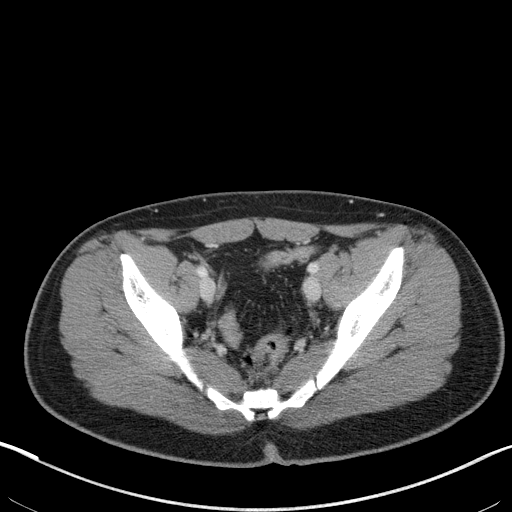
[im 32/88  soft-tissue]
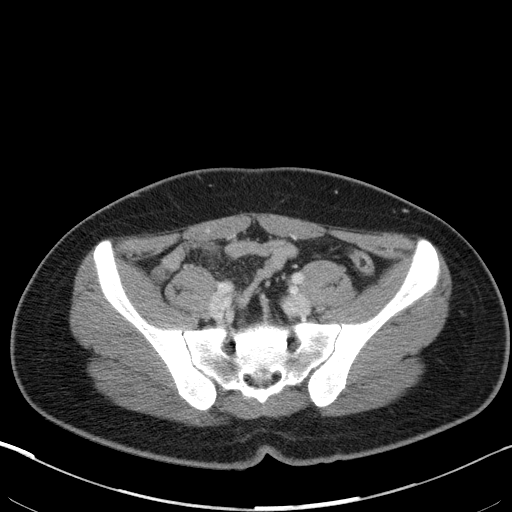
[im 39/88  soft-tissue]
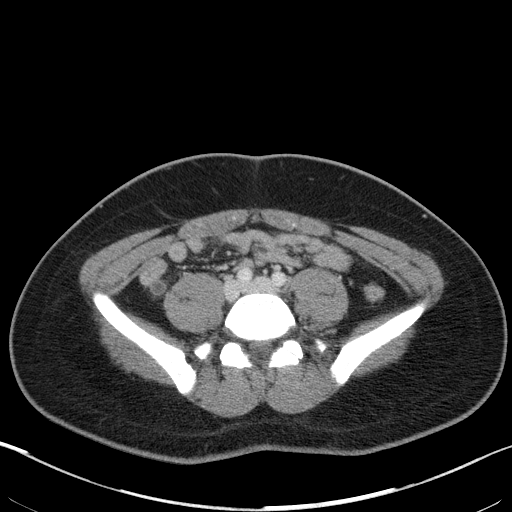
[im 46/88  soft-tissue]
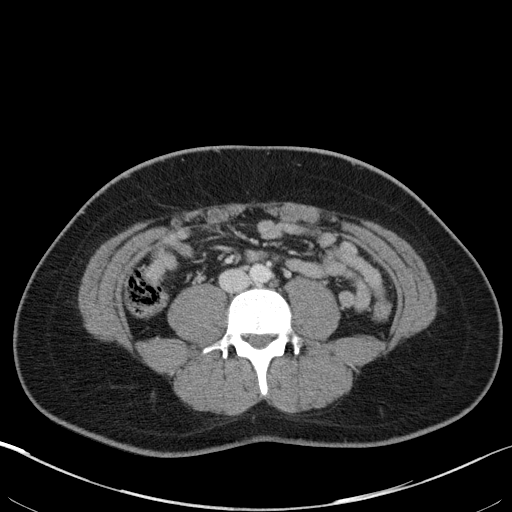
[im 49/88  soft-tissue]
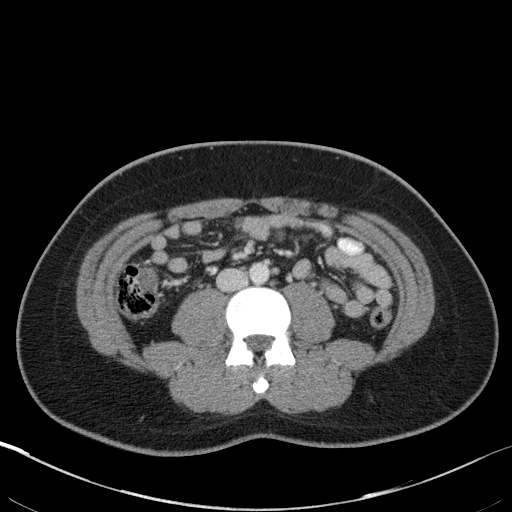
[im 56/88  soft-tissue]
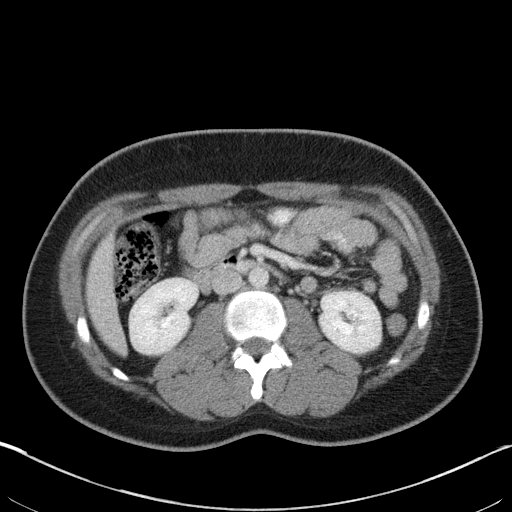
[im 56/88  bone]
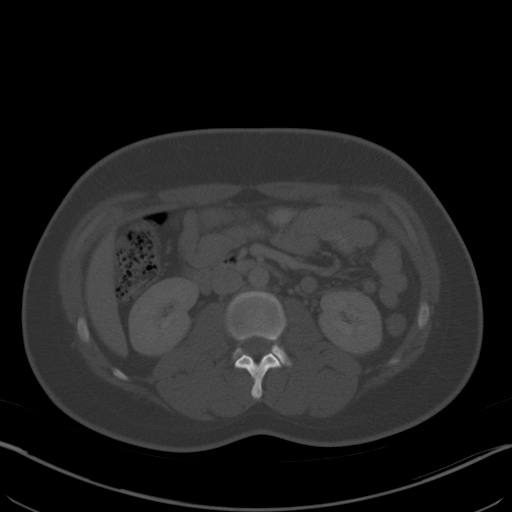
[im 63/88  soft-tissue]
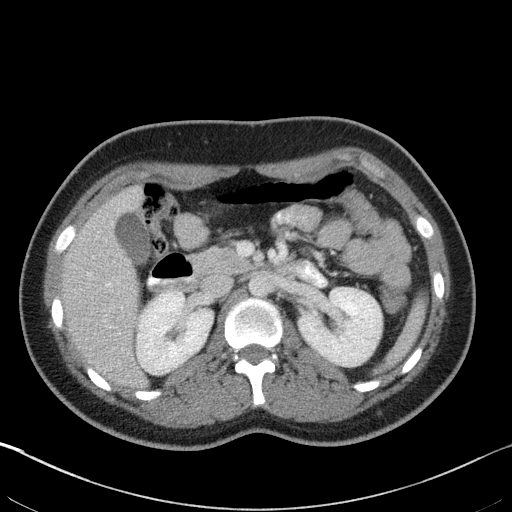
[im 70/88  soft-tissue]
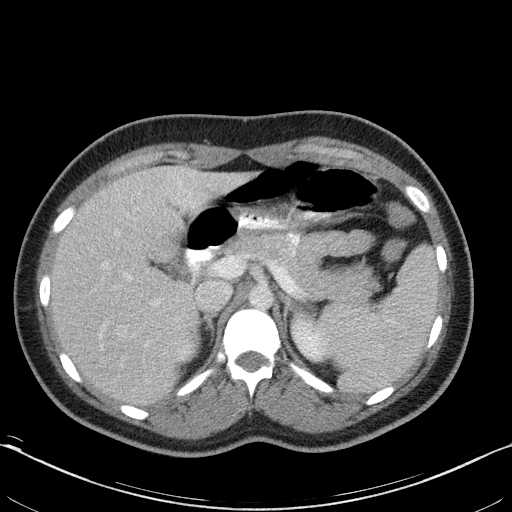
[im 77/88  soft-tissue]
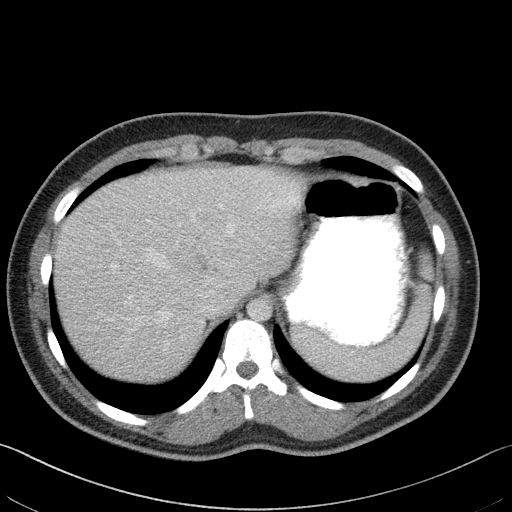
[im 84/88  soft-tissue]
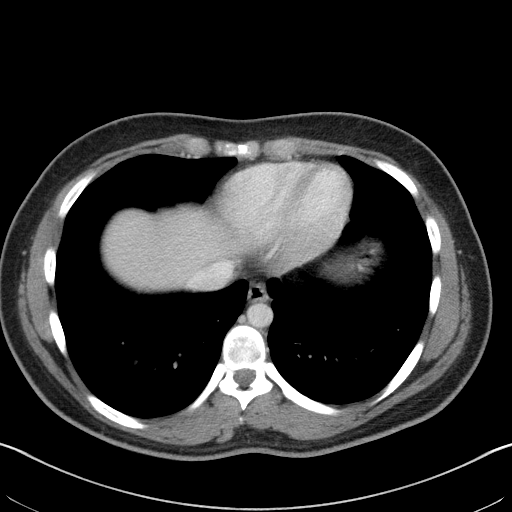

[Series 5: abd/pelvis 3.0 coronal · coronal · 0.84mm/px · 3 of 74 slices shown]
[im 25/74  soft-tissue]
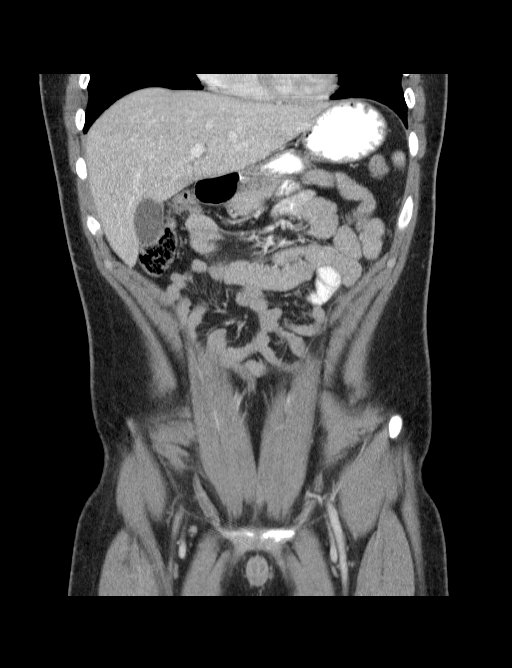
[im 33/74  soft-tissue]
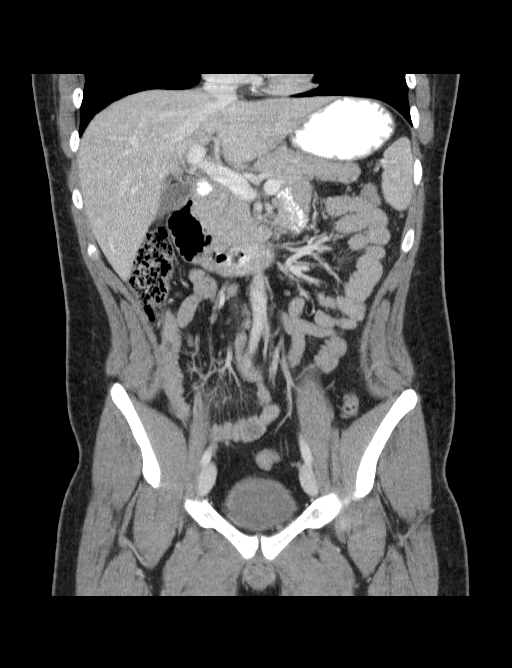
[im 41/74  soft-tissue]
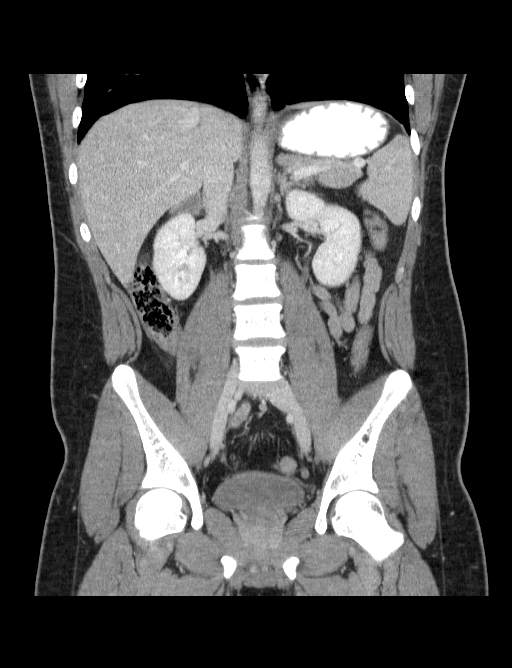

[16 of 46 positions shown; findings below may reference images not displayed]

FINDINGS: Lung bases are unremarkable.  Sagittal images of the
spine are unremarkable.

Liver, spleen, pancreas and adrenals are unremarkable.  Kidneys are
symmetrical in size and enhancement.  No hydronephrosis or
hydroureter.  No small bowel obstruction.  No ascites or free air.
No adenopathy.

There is abnormal distention of the appendix with fluid in the
right lower quadrant.  This is best visualized in axial image 52.
A calcified appendicolith is noted at the base of the appendix
measures about 6 mm.

The appendix measures 1.2 cm in diameter with mild enhancement of
the wall.  Findings are consistent with acute appendicitis.  The
appendix is best visualized in the sagittal image 31.

Prostate gland seminal vesicles are unremarkable.  Urinary bladder
is unremarkable.
IMPRESSION: 1.  Abnormal enlargement of the appendix with fluid and mild
enhancement of the wall.  There is a calcified appendicolith at the
appendix base.  Findings are consistent with acute appendicitis.
2.  No hydronephrosis or hydroureter.
3.  No small bowel obstruction.

Critical findings discussed with Dr. Gantz.

## 2022-04-25 ENCOUNTER — Ambulatory Visit
Admission: EM | Admit: 2022-04-25 | Discharge: 2022-04-25 | Disposition: A | Discharge status: Home / Self Care | Payer: BC Managed Care – PPO | Attending: Urgent Care | Admitting: Urgent Care

## 2022-04-25 ENCOUNTER — Encounter: Payer: Self-pay | Admitting: Emergency Medicine

## 2022-04-25 DIAGNOSIS — R070 Pain in throat: Secondary | ICD-10-CM | POA: Insufficient documentation

## 2022-04-25 DIAGNOSIS — R0982 Postnasal drip: Secondary | ICD-10-CM | POA: Insufficient documentation

## 2022-04-25 DIAGNOSIS — H109 Unspecified conjunctivitis: Secondary | ICD-10-CM | POA: Insufficient documentation

## 2022-04-25 LAB — POCT RAPID STREP A (OFFICE): Rapid Strep A Screen: NEGATIVE

## 2022-04-25 MED ORDER — CETIRIZINE HCL 10 MG PO TABS: 10.0000 mg | ORAL_TABLET | Freq: Every day | ORAL | 0 refills | Status: DC

## 2022-04-25 MED ORDER — TOBRAMYCIN 0.3 % OP SOLN: 1.0000 [drp] | OPHTHALMIC | 0 refills | Status: DC

## 2022-04-25 MED ORDER — PSEUDOEPHEDRINE HCL 60 MG PO TABS: 60.0000 mg | ORAL_TABLET | Freq: Three times a day (TID) | ORAL | 0 refills | Status: DC | PRN

## 2022-04-25 NOTE — ED Triage Notes (Signed)
Pt here with left eye redness and irritation starting yesterday as well as a sore throat.  ?

## 2022-04-25 NOTE — ED Provider Notes (Signed)
?[facility] ? ? ?MRN: [id number] DOB: 1991-02-13 ? ?Subjective:  ? ?Cory Love is a 31 y.o. male presenting for 1 day history of acute onset persistent left eye redness with drainage and throat pain.  No cough, chest pain, shortness of breath.  Patient is non-smoker.  No history of allergies.  No eye trauma, photophobia, contact lens use. ? ?No current facility-administered medications for this encounter. ?No current outpatient medications on file.  ? ?No Known Allergies ? ?History reviewed. No pertinent past medical history.  ? ?Past Surgical History:  ?Procedure Laterality Date  ? LAPAROSCOPIC APPENDECTOMY N/A 03/08/2013  ? Procedure: APPENDECTOMY LAPAROSCOPIC;  Surgeon: Lodema Pilot, DO;  Location: [facility];  Service: General;  Laterality: N/A;  ? ? ?Family History  ?Family history unknown: Yes  ? ? ?Social History  ? ?Tobacco Use  ? Smoking status: Never  ?Substance Use Topics  ? Alcohol use: No  ? Drug use: No  ? ? ?ROS ? ? ?Objective:  ? ?Vitals: ?BP 128/80   Pulse 73   Temp 98 ?F (36.7 ?C)   Resp 20   SpO2 98%  ? ?Physical Exam ?Constitutional:   ?   General: He is not in acute distress. ?   Appearance: Normal appearance. He is well-developed and normal weight. He is not ill-appearing, toxic-appearing or diaphoretic.  ?HENT:  ?   Head: Normocephalic and atraumatic.  ?   Right Ear: External ear normal.  ?   Left Ear: External ear normal.  ?   Nose: Nose normal.  ?   Mouth/Throat:  ?   Pharynx: No oropharyngeal exudate or posterior oropharyngeal erythema.  ?   Comments: Postnasal drainage overlying pharynx. ?Eyes:  ?   General: Lids are everted, no foreign bodies appreciated. No scleral icterus.    ?   Right eye: No foreign body, discharge or hordeolum.     ?   Left eye: Discharge present.No foreign body or hordeolum.  ?   Extraocular Movements: Extraocular movements intact.  ?   Conjunctiva/sclera:  ?   Right eye: Right conjunctiva is not injected. No chemosis, exudate  or hemorrhage. ?   Left eye: Left conjunctiva is injected. No chemosis, exudate or hemorrhage. ?Cardiovascular:  ?   Rate and Rhythm: Normal rate.  ?Pulmonary:  ?   Effort: Pulmonary effort is normal.  ?Musculoskeletal:  ?   Cervical back: Normal range of motion.  ?Skin: ?   General: Skin is warm and dry.  ?Neurological:  ?   Mental Status: He is alert and oriented to person, place, and time.  ?Psychiatric:     ?   Mood and Affect: Mood normal.     ?   Behavior: Behavior normal.     ?   Thought Content: Thought content normal.     ?   Judgment: Judgment normal.  ? ?Results for orders placed or performed during the hospital encounter of 04/25/22 (from the past 24 hour(s))  ?POCT rapid strep A     Status: Normal  ? Collection Time: 04/25/22  7:27 PM  ?Result Value Ref Range  ? Rapid Strep A Screen Negative Negative  ? ? ?Assessment and Plan :  ? ?PDMP not reviewed this encounter. ? ?1. Bacterial conjunctivitis of left eye   ?2. Throat pain   ?3. Post-nasal drainage   ? ?Strep culture pending, recommended managing with supportive care for viral pharyngitis, viral respiratory illness, allergic rhinitis. Will start tobramycin to address bacterial conjunctivitis of  left eye. Counseled patient on potential for adverse effects with medications prescribed/recommended today, ER and return-to-clinic precautions discussed, patient verbalized understanding.  ?  ?Wallis Bamberg, PA-C ?04/25/22 2000 ? ?

## 2022-04-28 ENCOUNTER — Encounter: Payer: Self-pay | Admitting: Emergency Medicine

## 2022-04-28 ENCOUNTER — Telehealth (HOSPITAL_COMMUNITY): Payer: Self-pay | Admitting: Emergency Medicine

## 2022-04-28 LAB — CULTURE, GROUP A STREP (THRC)

## 2022-04-28 MED ORDER — AZITHROMYCIN 250 MG PO TABS: 250.0000 mg | ORAL_TABLET | Freq: Every day | ORAL | 0 refills | Status: DC

## 2022-06-15 ENCOUNTER — Emergency Department (HOSPITAL_COMMUNITY)
Admission: EM | Admit: 2022-06-15 | Discharge: 2022-06-15 | Discharge status: Against Medical Advice | Payer: BC Managed Care – PPO | Source: Home / Self Care

## 2023-10-12 DIAGNOSIS — Z6841 Body Mass Index (BMI) 40.0 and over, adult: Secondary | ICD-10-CM | POA: Diagnosis not present

## 2023-10-12 DIAGNOSIS — Z0000 Encounter for general adult medical examination without abnormal findings: Secondary | ICD-10-CM | POA: Diagnosis not present

## 2024-04-23 ENCOUNTER — Ambulatory Visit: Admitting: Internal Medicine

## 2024-04-23 ENCOUNTER — Encounter: Payer: Self-pay | Admitting: Internal Medicine

## 2024-04-23 VITALS — BP 120/80 | HR 80 | Temp 97.6°F | Ht 66.0 in | Wt 277.0 lb

## 2024-04-23 DIAGNOSIS — Z6841 Body Mass Index (BMI) 40.0 and over, adult: Secondary | ICD-10-CM

## 2024-04-23 DIAGNOSIS — E66813 Obesity, class 3: Secondary | ICD-10-CM | POA: Diagnosis not present

## 2024-04-23 LAB — LIPID PANEL
Cholesterol: 190 mg/dL (ref 0–200)
HDL: 35.7 mg/dL — ABNORMAL LOW (ref >39.00)
LDL Cholesterol: 121 mg/dL — ABNORMAL HIGH (ref 0–99)
NonHDL: 154.73
Total CHOL/HDL Ratio: 5
Triglycerides: 168 mg/dL — ABNORMAL HIGH (ref 0.0–149.0)
VLDL: 33.6 mg/dL (ref 0.0–40.0)

## 2024-04-23 LAB — COMPREHENSIVE METABOLIC PANEL WITH GFR
ALT: 30 U/L (ref 0–53)
AST: 15 U/L (ref 0–37)
Albumin: 4.3 g/dL (ref 3.5–5.2)
Alkaline Phosphatase: 90 U/L (ref 39–117)
BUN: 11 mg/dL (ref 6–23)
CO2: 26 meq/L (ref 19–32)
Calcium: 9.3 mg/dL (ref 8.4–10.5)
Chloride: 104 meq/L (ref 96–112)
Creatinine, Ser: 0.68 mg/dL (ref 0.40–1.50)
GFR: 124.56 mL/min (ref >60.00)
Glucose, Bld: 116 mg/dL — ABNORMAL HIGH (ref 70–99)
Potassium: 4.1 meq/L (ref 3.5–5.1)
Sodium: 140 meq/L (ref 135–145)
Total Bilirubin: 0.5 mg/dL (ref 0.2–1.2)
Total Protein: 7 g/dL (ref 6.0–8.3)

## 2024-04-23 LAB — TSH: TSH: 2.31 u[IU]/mL (ref 0.35–5.50)

## 2024-04-23 LAB — HEMOGLOBIN A1C: Hgb A1c MFr Bld: 6.6 % — ABNORMAL HIGH (ref 4.6–6.5)

## 2024-04-23 NOTE — Progress Notes (Signed)
 [facility] [address] [place] [address] Dept: [phone] Dept Fax: [phone]  New Patient Office Visit  Subjective:   Cory Love Jun 13, 1991 04/23/2024  Chief Complaint  Patient presents with   Establish Care    HPI: Cory Love presents today to establish care at [facility]. Introduced to Publishing rights manager role and practice setting.  All questions answered.  Concerns: See below   Discussed the use of AI scribe software for clinical note transcription with the patient, who gave verbal consent to proceed.  History of Present Illness   Cory Love is a 33 year old male who presents to establish care and discuss weight.   He is concerned about potential prediabetes due to a family history of the condition, as his father and brother have recently been diagnosed with prediabetes. He has not had any blood work done in approximately ten years and wishes to start regular health check-ups.  He has a history of being overweight and is concerned about his dietary habits, which include irregular meal patterns and frequent consumption of fast food due to convenience. He typically eats once or twice a day, sometimes not until late afternoon, and acknowledges that his diet is not regular. He occasionally eats salads at home or meals at his mother's house. No regular consistent exercise , does take walks with his fiancee sometimes.      The following portions of the patient's history were reviewed and updated as appropriate: past medical history, past surgical history, family history, social history, allergies, medications, and problem list.   Patient Active Problem List   Diagnosis Date Noted   Class 3 severe obesity due to excess calories without serious comorbidity with body mass index (BMI) of 40.0 to 44.9 in adult 04/23/2024   History reviewed. No pertinent  past medical history. Past Surgical History:  Procedure Laterality Date   LAPAROSCOPIC APPENDECTOMY N/A 03/08/2013   Procedure: APPENDECTOMY LAPAROSCOPIC;  Surgeon: Evander Hills, DO;  Location: [facility];  Service: General;  Laterality: N/A;   Family History  Family history unknown: Yes   No current outpatient medications on file. No Known Allergies  ROS: A complete ROS was performed with pertinent positives/negatives noted in the HPI. The remainder of the ROS are negative.   Objective:   Today's Vitals   04/23/24 0946  BP: 120/80  Pulse: 80  Temp: 97.6 F (36.4 C)  TempSrc: Temporal  SpO2: 98%  Weight: 277 lb (125.6 kg)  Height: 5\' 6"  (1.676 m)    GENERAL: Well-appearing, in NAD. obese SKIN: Pink, warm and dry.  NECK: Trachea midline. Full ROM w/o pain or tenderness. No lymphadenopathy.  RESPIRATORY: Chest wall symmetrical. Respirations even and non-labored. Breath sounds clear to auscultation bilaterally.  CARDIAC: S1, S2 present, regular rate and rhythm. Peripheral pulses 2+ bilaterally.  EXTREMITIES: Without clubbing, cyanosis, or edema.  NEUROLOGIC:  Steady, even gait.  PSYCH/MENTAL STATUS: Alert, oriented x 3. Cooperative, appropriate mood and affect.   Health Maintenance Due  Topic Date Due   HIV Screening  Never done   Hepatitis C Screening  Never done   DTaP/Tdap/Td (1 - Tdap) Never done   HPV VACCINES (2 - Male 3-dose series) 11/22/2018    No results found for any visits on 04/23/24.  Assessment & Plan:  Assessment and Plan    Obesity Obesity with potential prediabetes risk. Emphasized balanced diet, regular meals, and increased physical activity. Highlighted calorie deficit and regular exercise for weight  loss. - Recommend using the 'Lose It' app to track caloric intake and set weight loss goals. - Advise increasing protein intake to at least 30-60 grams per meal. - Encourage regular physical activity, including brisk walking for 30-60 minutes daily and  weightlifting exercises. - Patient is fasting, obtain CMP, TSH, A1C, Lipid panel     Orders Placed This Encounter  Procedures   Lipid panel   Comp Met (CMET)   TSH   Hemoglobin A1C   No orders of the defined types were placed in this encounter.   Return in about 6 months (around 10/24/2024) for Annual Physical Exam with fasting lab work.   Gavin Kast, FNP

## 2024-04-23 NOTE — Patient Instructions (Signed)
Lose it  App

## 2024-04-29 ENCOUNTER — Encounter: Payer: Self-pay | Admitting: Internal Medicine

## 2024-04-29 ENCOUNTER — Ambulatory Visit: Payer: Self-pay | Admitting: Internal Medicine

## 2024-04-29 DIAGNOSIS — E1165 Type 2 diabetes mellitus with hyperglycemia: Secondary | ICD-10-CM

## 2024-04-29 DIAGNOSIS — E1169 Type 2 diabetes mellitus with other specified complication: Secondary | ICD-10-CM | POA: Insufficient documentation

## 2024-04-29 DIAGNOSIS — E785 Hyperlipidemia, unspecified: Secondary | ICD-10-CM | POA: Insufficient documentation

## 2024-04-30 ENCOUNTER — Telehealth: Payer: Self-pay

## 2024-04-30 MED ORDER — METFORMIN HCL ER 500 MG PO TB24
500.0000 mg | ORAL_TABLET | Freq: Every day | ORAL | 1 refills | Status: AC
Start: 2024-04-30 — End: ?

## 2024-04-30 NOTE — Telephone Encounter (Signed)
 Forwarding message   Copied from CRM [id number]. Topic: Clinical - Lab/Test Results >> Apr 30, 2024 11:09 AM Clyde Darling P wrote: Reason for CRM: Pt called back to go over lab results / relayed lab results- pt is willing to start metformin.

## 2024-04-30 NOTE — Telephone Encounter (Signed)
 Metformin sent in for patient. thanks

## 2024-05-07 ENCOUNTER — Ambulatory Visit: Admitting: Internal Medicine

## 2024-05-07 ENCOUNTER — Encounter: Payer: Self-pay | Admitting: Internal Medicine

## 2024-05-07 VITALS — BP 110/70 | HR 68 | Temp 97.6°F | Ht 66.0 in | Wt 270.8 lb

## 2024-05-07 DIAGNOSIS — Z7984 Long term (current) use of oral hypoglycemic drugs: Secondary | ICD-10-CM | POA: Diagnosis not present

## 2024-05-07 DIAGNOSIS — E785 Hyperlipidemia, unspecified: Secondary | ICD-10-CM | POA: Diagnosis not present

## 2024-05-07 DIAGNOSIS — E1169 Type 2 diabetes mellitus with other specified complication: Secondary | ICD-10-CM | POA: Diagnosis not present

## 2024-05-07 DIAGNOSIS — E1165 Type 2 diabetes mellitus with hyperglycemia: Secondary | ICD-10-CM | POA: Diagnosis not present

## 2024-05-07 LAB — MICROALBUMIN / CREATININE URINE RATIO
Creatinine,U: 178.4 mg/dL
Microalb Creat Ratio: 8.4 mg/g (ref 0.0–30.0)
Microalb, Ur: 1.5 mg/dL (ref 0.0–1.9)

## 2024-05-07 NOTE — Progress Notes (Signed)
 [facility] [address] [place] [address] Dept: [phone] Dept Fax: [phone]  Acute Care Office Visit  Subjective:   Cory Love [birth year] 05/07/2024  Chief Complaint  Patient presents with   Consult    Discuss lab results     HPI:  Discussed the use of AI scribe software for clinical note transcription with the patient, who gave verbal consent to proceed.  History of Present Illness   Cory Love is a 33 year old male who presents for a follow-up on blood work results indicating type 2 diabetes.  He has been diagnosed with type 2 diabetes following blood work that showed an A1c level of 6.6%. Since the diagnosis, he has been eager to understand his condition better and has actively made lifestyle changes.  He has a family history of his father and brother both being being prediabetic.  He has started dietary changes, including consuming yogurt with blueberries for breakfast and a large salad with three eggs for lunch. He is concerned about potential cholesterol issues from eating eggs, as mentioned by his brother.  He has been taking metformin  since last week and feels good with no issues. No N/V/D. He has increased his physical activity by going on more walks and has noticed a weight loss of about seven pounds. He is cautious about alcohol consumption while on metformin  and has been avoiding drinking since starting the medication.  He is concerned about his LDL cholesterol levels and seeks clarification on the implications of this finding. He is actively working on his diet and exercise regimen to manage his diabetes and is interested in further education on nutrition and diabetes management.     No polydipsia, polyphagia, polyuria.    Wt Readings from Last 3 Encounters:  05/07/24 270 lb 12.8 oz (122.8 kg)  04/23/24 277 lb (125.6 kg)  04/10/13 172 lb (78 kg) (74%, Z= 0.63)*   *  Growth percentiles are based on CDC (Boys, 2-20 Years) data.     The following portions of the patient's history were reviewed and updated as appropriate: past medical history, past surgical history, family history, social history, allergies, medications, and problem list.   Patient Active Problem List   Diagnosis Date Noted   Type 2 diabetes mellitus with hyperglycemia (HCC) 04/29/2024   Hyperlipidemia associated with type 2 diabetes mellitus (HCC) 04/29/2024   Class 3 severe obesity due to excess calories without serious comorbidity with body mass index (BMI) of 40.0 to 44.9 in adult 04/23/2024   History reviewed. No pertinent past medical history. Past Surgical History:  Procedure Laterality Date   LAPAROSCOPIC APPENDECTOMY N/A 03/08/2013   Procedure: APPENDECTOMY LAPAROSCOPIC;  Surgeon: Evander Hills, DO;  Location: [facility];  Service: General;  Laterality: N/A;   Family History  Family history unknown: Yes    Current Outpatient Medications:    metFORMIN  (GLUCOPHAGE -XR) 500 MG 24 hr tablet, Take 1 tablet (500 mg total) by mouth daily with breakfast. (Patient not taking: Reported on 05/07/2024), Disp: 90 tablet, Rfl: 1 No Known Allergies   ROS: A complete ROS was performed with pertinent positives/negatives noted in the HPI. The remainder of the ROS are negative.    Objective:   Today's Vitals   05/07/24 0844  BP: 110/70  Pulse: 68  Temp: 97.6 F (36.4 C)  TempSrc: Temporal  SpO2: 99%  Weight: 270 lb 12.8 oz (122.8 kg)  Height: 5\' 6"  (1.676 m)    GENERAL: Well-appearing, in NAD. Well nourished.  SKIN: Pink, warm and dry.  RESPIRATORY: Respirations even and non-labored.  CARDIAC: Peripheral pulses 2+ bilaterally.  EXTREMITIES: Without clubbing, cyanosis, or edema.  NEUROLOGIC: No motor or sensory deficits. Steady, even gait. Sensory exam of the foot is normal, tested with the monofilament. Good pulses, no lesions or ulcers, good peripheral pulses. PSYCH/MENTAL STATUS:  Alert, oriented x 3. Cooperative, appropriate mood and affect.    No results found for any visits on 05/07/24.    Assessment & Plan:  Assessment and Plan    Type 2 diabetes mellitus Newly diagnosed with A1c of 6.6%. Initiated lifestyle changes with 7-pound weight loss. Taking metformin  without adverse effects. Emphasized dietary management, carbohydrate counting, and exercise to control glucose. Advised on metformin  and alcohol interaction. Highlighted importance of glucose control to prevent complications. - Continue metformin  once daily - Refer to diabetes nutrition consult for dietary education and management. - Encouraged continued dietary modifications focusing on low carbohydrates and high protein intake. - Encouraged regular physical activity, including walking and weight lifting. - Provided educational materials on diabetes management and dietary guidelines. - Microalbumin urine test  Hyperlipidemia associated with type 2 diabetes Emphasized diet and exercise for cholesterol management. - Encouraged dietary modifications to reduce cholesterol intake, focusing on lean proteins and avoiding greasy, fatty foods. - Encouraged regular physical activity to aid in cholesterol management.       No orders of the defined types were placed in this encounter.  Orders Placed This Encounter  Procedures   Microalbumin / creatinine urine ratio   Referral to Nutrition and Diabetes Services    Referral Priority:   Routine    Referral Type:   Consultation    Referral Reason:   Specialty Services Required    Number of Visits Requested:   1   Lab Orders         Microalbumin / creatinine urine ratio     No images are attached to the encounter or orders placed in the encounter.  Return in about 3 months (around 08/07/2024) for Diabetes.   Gavin Kast, FNP

## 2024-05-09 ENCOUNTER — Ambulatory Visit: Payer: Self-pay | Admitting: Internal Medicine

## 2024-06-26 ENCOUNTER — Encounter: Payer: Self-pay | Admitting: Skilled Nursing Facility1

## 2024-06-26 ENCOUNTER — Encounter: Attending: Internal Medicine | Admitting: Skilled Nursing Facility1

## 2024-06-26 VITALS — Ht 68.0 in | Wt 264.8 lb

## 2024-06-26 DIAGNOSIS — E1165 Type 2 diabetes mellitus with hyperglycemia: Secondary | ICD-10-CM | POA: Insufficient documentation

## 2024-06-26 NOTE — Progress Notes (Signed)
 A1C 6.6  DM medications: Metformin    Pt states he has made a lot of changes including eating breakfast and cutting back on fast food and cutting back on red bull and cocktails.  Pt states he does not know why but he is not motivated to make changes with his physical activity or making meals form home.   Diabetes Self-Management Education  Visit Type: First/Initial  Appt. Start Time: 4:18 Appt. End Time: 5:18  06/26/2024  Cory Love, identified by name and date of birth, is a 33 y.o. male with a diagnosis of Diabetes: Type 2.   ASSESSMENT  Height 5' 8 (1.727 m), weight 264 lb 12.8 oz (120.1 kg). Body mass index is 40.26 kg/m.   Diabetes Self-Management Education - 06/26/24 1622       Visit Information   Visit Type First/Initial      Initial Visit   Diabetes Type Type 2    Are you currently following a meal plan? No    Are you taking your medications as prescribed? Yes      Health Coping   How would you rate your overall health? Fair      Psychosocial Assessment   Patient Belief/Attitude about Diabetes Motivated to manage diabetes    What is the hardest part about your diabetes right now, causing you the most concern, or is the most worrisome to you about your diabetes?   Making healty food and beverage choices    Self-care barriers None    Self-management support Family    Patient Concerns Nutrition/Meal planning    Special Needs None    Preferred Learning Style Hands on    Learning Readiness Not Ready    How often do you need to have someone help you when you read instructions, pamphlets, or other written materials from your doctor or pharmacy? 1 - Never      Pre-Education Assessment   Patient understands the diabetes disease and treatment process. Needs Instruction    Patient understands incorporating nutritional management into lifestyle. Needs Instruction    Patient undertands incorporating physical activity into lifestyle. Needs Instruction     Patient understands using medications safely. Needs Instruction    Patient understands monitoring blood glucose, interpreting and using results Needs Instruction    Patient understands prevention, detection, and treatment of acute complications. Needs Instruction    Patient understands prevention, detection, and treatment of chronic complications. Needs Instruction    Patient understands how to develop strategies to address psychosocial issues. Needs Instruction    Patient understands how to develop strategies to promote health/change behavior. Needs Instruction      Complications   Last HgB A1C per patient/outside source 6.6 %    How often do you check your blood sugar? 0 times/day (not testing)    Have you had a dilated eye exam in the past 12 months? Yes    Have you had a dental exam in the past 12 months? Yes    Are you checking your feet? Yes    How many days per week are you checking your feet? 7      Dietary Intake   Breakfast yogurt or egg bite    Snack (morning) chips    Lunch eggs 3-4 or eatne out    Dinner eaten out    Beverage(s) water, diet soda or red bull, water, liquor      Activity / Exercise   Activity / Exercise Type ADL's    How many days per  week do you exercise? 0    How many minutes per day do you exercise? 0    Total minutes per week of exercise 0      Patient Education   Previous Diabetes Education No    Disease Pathophysiology Definition of diabetes, type 1 and 2, and the diagnosis of diabetes    Healthy Eating Plate Method;Carbohydrate counting;Food label reading, portion sizes and measuring food.;Effects of alcohol on blood glucose and safety factors with consumption of alcohol.    Being Active Role of exercise on diabetes management, blood pressure control and cardiac health.    Medications Reviewed patients medication for diabetes, action, purpose, timing of dose and side effects.    Monitoring Taught/evaluated SMBG meter.;Interpreting lab values - A1C,  lipid, urine microalbumina.;Daily foot exams;Yearly dilated eye exam;Identified appropriate SMBG and/or A1C goals.    Acute complications Taught prevention, symptoms, and  treatment of hypoglycemia - the 15 rule.;Discussed and identified patients' prevention, symptoms, and treatment of hyperglycemia.    Chronic complications Dental care;Retinopathy and reason for yearly dilated eye exams;Nephropathy, what it is, prevention of, the use of ACE, ARB's and early detection of through urine microalbumia.;Relationship between chronic complications and blood glucose control;Assessed and discussed foot care and prevention of foot problems;Lipid levels, blood glucose control and heart disease    Diabetes Stress and Support Role of stress on diabetes      Individualized Goals (developed by patient)   Nutrition Follow meal plan discussed;Adjust meds/carbs with exercise as discussed;General guidelines for healthy choices and portions discussed    Physical Activity Exercise 5-7 days per week;30 minutes per day    Medications take my medication as prescribed    Monitoring  Test my blood glucose as discussed;Test blood glucose pre and post meals as discussed    Problem Solving Sleep Pattern;Eating Pattern    Reducing Risk do foot checks daily;treat hypoglycemia with 15 grams of carbs if blood glucose less than 70mg /dL      Post-Education Assessment   Patient understands the diabetes disease and treatment process. Demonstrates understanding / competency    Patient understands incorporating nutritional management into lifestyle. Demonstrates understanding / competency    Patient undertands incorporating physical activity into lifestyle. Demonstrates understanding / competency    Patient understands using medications safely. Demonstrates understanding / competency    Patient understands prevention, detection, and treatment of acute complications. Demonstrates understanding / competency    Patient understands  prevention, detection, and treatment of chronic complications. Demonstrates understanding / competency    Patient understands how to develop strategies to address psychosocial issues. Demonstrates understanding / competency    Patient understands how to develop strategies to promote health/change behavior. Demonstrates understanding / competency      Outcomes   Expected Outcomes Demonstrated interest in learning. Expect positive outcomes    Future DMSE 4-6 wks    Program Status Completed          Individualized Plan for Diabetes Self-Management Training:   Learning Objective:  Patient will have a greater understanding of diabetes self-management. Patient education plan is to attend individual and/or group sessions per assessed needs and concerns.    Education material provided: ADA - How to Thrive: A Guide for Your Journey with Diabetes, Meal plan card, and Snack sheet  If problems or questions, patient to contact team via:  Phone and Email  Future DSME appointment: 4-6 wks

## 2024-08-07 ENCOUNTER — Ambulatory Visit: Admitting: Internal Medicine

## 2024-08-07 ENCOUNTER — Encounter: Payer: Self-pay | Admitting: Internal Medicine

## 2024-08-07 VITALS — BP 108/60 | HR 80 | Temp 98.0°F | Ht 68.0 in | Wt 261.8 lb

## 2024-08-07 DIAGNOSIS — E1165 Type 2 diabetes mellitus with hyperglycemia: Secondary | ICD-10-CM

## 2024-08-07 DIAGNOSIS — E66812 Obesity, class 2: Secondary | ICD-10-CM | POA: Diagnosis not present

## 2024-08-07 DIAGNOSIS — Z6839 Body mass index (BMI) 39.0-39.9, adult: Secondary | ICD-10-CM

## 2024-08-07 DIAGNOSIS — Z7984 Long term (current) use of oral hypoglycemic drugs: Secondary | ICD-10-CM

## 2024-08-07 DIAGNOSIS — Z23 Encounter for immunization: Secondary | ICD-10-CM

## 2024-08-07 LAB — POCT GLYCOSYLATED HEMOGLOBIN (HGB A1C): Hemoglobin A1C: 5.3 % (ref 4.0–5.6)

## 2024-08-07 MED ORDER — BLOOD GLUCOSE MONITORING SUPPL DEVI: 1.0000 | Freq: Three times a day (TID) | 0 refills | Status: AC

## 2024-08-07 MED ORDER — LANCETS MISC. MISC: 1.0000 | Freq: Three times a day (TID) | 3 refills | Status: AC

## 2024-08-07 MED ORDER — BLOOD GLUCOSE TEST VI STRP: ORAL_STRIP | 3 refills | Status: AC

## 2024-08-07 NOTE — Progress Notes (Signed)
 [facility] [address] [place] [address] Dept: [phone] Dept Fax: [phone]    Subjective:   Cory Love 11-23-1991 08/07/2024  Chief Complaint  Patient presents with   Follow-up    Rx for blood glucose     HPI: Cory Love presents today for re-assessment and management of chronic medical conditions.  Discussed the use of AI scribe software for clinical note transcription with the patient, who gave verbal consent to proceed.  History of Present Illness   Cory Love is a 33 year old male with diabetes who presents for a follow-up on diabetes management and weight loss.  He has experienced a weight loss of about nine to ten pounds since his last visit in May, although he notes fluctuations in his weight. He has been working with a nutritionist who provided guidance on diabetes management, including dietary recommendations. The nutritionist emphasized the importance of monitoring blood sugar levels and suggested using a device to track spikes in blood sugar.  He is currently taking metformin  but occasionally forgets doses, especially during celebrations when he consumes alcohol. He has been trying to reduce sugar intake.  His last recorded A1c was 6.6.  Exercise remains challenging for him, and he acknowledges the need to increase his physical activity. He has focused more on dietary changes but recognizes the importance of combining both diet and exercise for better management of his condition.  Lab Results  Component Value Date   HGBA1C 5.3 08/07/2024   HGBA1C 6.6 (H) 04/23/2024   Wt Readings from Last 3 Encounters:  08/07/24 261 lb 12.8 oz (118.8 kg)  06/26/24 264 lb 12.8 oz (120.1 kg)  05/07/24 270 lb 12.8 oz (122.8 kg)     The following portions of the patient's history were reviewed and updated as appropriate: past medical history, past surgical history, family  history, social history, allergies, medications, and problem list.   Patient Active Problem List   Diagnosis Date Noted   Type 2 diabetes mellitus with hyperglycemia (HCC) 04/29/2024   Hyperlipidemia associated with type 2 diabetes mellitus (HCC) 04/29/2024   Class 3 severe obesity due to excess calories without serious comorbidity with body mass index (BMI) of 40.0 to 44.9 in adult 04/23/2024   Past Medical History:  Diagnosis Date   Diabetes mellitus without complication (HCC)    Past Surgical History:  Procedure Laterality Date   LAPAROSCOPIC APPENDECTOMY N/A 03/08/2013   Procedure: APPENDECTOMY LAPAROSCOPIC;  Surgeon: Redell Faith, DO;  Location: [facility];  Service: General;  Laterality: N/A;   Family History  Family history unknown: Yes    Current Outpatient Medications:    Blood Glucose Monitoring Suppl DEVI, 1 each by Does not apply route in the morning, at noon, and at bedtime. May substitute to any manufacturer covered by patient's insurance., Disp: 1 each, Rfl: 0   Glucose Blood (BLOOD GLUCOSE TEST STRIPS) STRP, Use to check blood sugar 3 times a day as needed. May substitute to any manufacturer covered by patient's insurance., Disp: 200 strip, Rfl: 3   Lancets Misc. MISC, 1 each by Does not apply route in the morning, at noon, and at bedtime. May substitute to any manufacturer covered by patient's insurance., Disp: 200 each, Rfl: 3   metFORMIN  (GLUCOPHAGE -XR) 500 MG 24 hr tablet, Take 1 tablet (500 mg total) by mouth daily with breakfast., Disp: 90 tablet, Rfl: 1 No Known Allergies   ROS: A complete ROS was performed with pertinent positives/negatives noted in the HPI.  The remainder of the ROS are negative.    Objective:   Today's Vitals   08/07/24 0849  BP: 108/60  Pulse: 80  Temp: 98 F (36.7 C)  TempSrc: Temporal  SpO2: 99%  Weight: 261 lb 12.8 oz (118.8 kg)  Height: 5' 8 (1.727 m)    GENERAL: Well-appearing, in NAD. Well nourished.  SKIN: Pink, warm and dry.  No rash, lesion, ulceration, or ecchymoses.  NECK: Trachea midline. Full ROM w/o pain or tenderness. No lymphadenopathy.  RESPIRATORY: Chest wall symmetrical. Respirations even and non-labored. Breath sounds clear to auscultation bilaterally.  CARDIAC: S1, S2 present, regular rate and rhythm. Peripheral pulses 2+ bilaterally.  EXTREMITIES: Without clubbing, cyanosis, or edema.  NEUROLOGIC: No motor or sensory deficits. Steady, even gait.  PSYCH/MENTAL STATUS: Alert, oriented x 3. Cooperative, appropriate mood and affect.   Health Maintenance Due  Topic Date Due   HPV VACCINES (2 - Male 3-dose series) 11/22/2018    Results for orders placed or performed in visit on 08/07/24  POCT glycosylated hemoglobin (Hb A1C)  Result Value Ref Range   Hemoglobin A1C 5.3 4.0 - 5.6 %   HbA1c POC (<> result, manual entry)     HbA1c, POC (prediabetic range)     HbA1c, POC (controlled diabetic range)      The ASCVD Risk score (Arnett DK, et al., 2019) failed to calculate for the following reasons:   The 2019 ASCVD risk score is only valid for ages 32 to 63     Assessment & Plan:  Assessment and Plan    Type 2 diabetes mellitus, well controlled A1c improved to 5.3%. Weight loss achieved through dietary changes. Occasionally misses metformin  doses. - Order glucose monitoring device, strips, and lancets. - Check A1c today. - Ensure metformin  prescription is up to date and advise to request refill if needed.  Class 2 Obesity with serious comorbidity an body mass index of 39.0-39.9 Lost 10 pounds.  - Encourage continued weight loss efforts. - Advise on incorporating exercise gradually along with dietary changes.  General Health Maintenance Due for TDAP vaccine. - Administer TDAP vaccine today. - Advise on managing post-vaccination arm soreness with Tylenol  or ibuprofen and stretching.      Orders Placed This Encounter  Procedures   Tdap vaccine greater than or equal to 7yo IM   POCT  glycosylated hemoglobin (Hb A1C)   No images are attached to the encounter or orders placed in the encounter. Meds ordered this encounter  Medications   Blood Glucose Monitoring Suppl DEVI    Sig: 1 each by Does not apply route in the morning, at noon, and at bedtime. May substitute to any manufacturer covered by patient's insurance.    Dispense:  1 each    Refill:  0    Supervising Provider:   THOMPSON, AARON B [[id number]]   Glucose Blood (BLOOD GLUCOSE TEST STRIPS) STRP    Sig: Use to check blood sugar 3 times a day as needed. May substitute to any manufacturer covered by patient's insurance.    Dispense:  200 strip    Refill:  3    Supervising Provider:   THOMPSON, AARON B [[id number]]   Lancets Misc. MISC    Sig: 1 each by Does not apply route in the morning, at noon, and at bedtime. May substitute to any manufacturer covered by patient's insurance.    Dispense:  200 each    Refill:  3    Supervising Provider:   THOMPSON, AARON B [[id number]]  Return in about 4 months (around 12/07/2024) for diabetes, cholesterol , weight- fasting lab work at appointment .   [name] Senters, FNP

## 2024-12-15 ENCOUNTER — Ambulatory Visit: Admitting: Internal Medicine

## 2025-01-09 ENCOUNTER — Ambulatory Visit: Admitting: Internal Medicine

## 2025-01-09 VITALS — BP 110/60 | HR 93 | Temp 98.0°F | Ht 65.0 in | Wt 261.0 lb

## 2025-01-09 DIAGNOSIS — E1169 Type 2 diabetes mellitus with other specified complication: Secondary | ICD-10-CM | POA: Diagnosis not present

## 2025-01-09 DIAGNOSIS — Z114 Encounter for screening for human immunodeficiency virus [HIV]: Secondary | ICD-10-CM

## 2025-01-09 DIAGNOSIS — K921 Melena: Secondary | ICD-10-CM

## 2025-01-09 DIAGNOSIS — E785 Hyperlipidemia, unspecified: Secondary | ICD-10-CM

## 2025-01-09 DIAGNOSIS — Z1159 Encounter for screening for other viral diseases: Secondary | ICD-10-CM

## 2025-01-09 DIAGNOSIS — E1165 Type 2 diabetes mellitus with hyperglycemia: Secondary | ICD-10-CM | POA: Diagnosis not present

## 2025-01-09 DIAGNOSIS — Z7984 Long term (current) use of oral hypoglycemic drugs: Secondary | ICD-10-CM | POA: Diagnosis not present

## 2025-01-09 DIAGNOSIS — Z23 Encounter for immunization: Secondary | ICD-10-CM | POA: Diagnosis not present

## 2025-01-09 LAB — LIPID PANEL
Cholesterol: 172 mg/dL (ref 28–200)
HDL: 41.7 mg/dL
LDL Cholesterol: 106 mg/dL — ABNORMAL HIGH (ref 10–99)
NonHDL: 130.64
Total CHOL/HDL Ratio: 4
Triglycerides: 122 mg/dL (ref 10.0–149.0)
VLDL: 24.4 mg/dL (ref 0.0–40.0)

## 2025-01-09 LAB — BASIC METABOLIC PANEL WITH GFR
BUN: 10 mg/dL (ref 6–23)
CO2: 28 meq/L (ref 19–32)
Calcium: 8.8 mg/dL (ref 8.4–10.5)
Chloride: 104 meq/L (ref 96–112)
Creatinine, Ser: 0.63 mg/dL (ref 0.40–1.50)
GFR: 126.83 mL/min
Glucose, Bld: 101 mg/dL — ABNORMAL HIGH (ref 70–99)
Potassium: 3.8 meq/L (ref 3.5–5.1)
Sodium: 140 meq/L (ref 135–145)

## 2025-01-09 LAB — HEMOGLOBIN A1C: Hgb A1c MFr Bld: 5.8 % (ref 4.6–6.5)

## 2025-01-09 NOTE — Progress Notes (Signed)
 " [facility] [address] [place] [address] Dept: [phone] Dept Fax: [phone]    Subjective:   Cory Love 1991/01/05 01/09/2025  Chief Complaint  Patient presents with   Follow-up    4 months, no concerns  pt is fasting    HPI: Donavan Kerlin presents today for re-assessment and management of chronic medical conditions.  Discussed the use of AI scribe software for clinical note transcription with the patient, who gave verbal consent to proceed.  History of Present Illness   Greyden Besecker is a 34 year old male with type 2 diabetes who presents for follow-up on his diabetes management.  His A1c was 6.6% eight months ago and improved to 5.3% five months ago. He has not been consistently taking his metformin  despite having a supply available. He has not eaten today and only drank water before the visit.  The last cholesterol check was in May 2025, and he is due for a re-evaluation of his cholesterol levels.  He experiences leg shaking and increased fidgetiness, which he has noticed more since transitioning to a work-from-home position. He describes himself as 'real fidgety' and has difficulty focusing, attributing this to the change in his work environment.  He noticed blood when wiping after bowel movements during a stressful period in September and October 2025. This occurred for one to two days and has since resolved. He associates this with stress during that time.  He received one HPV vaccine in 2019 but did not complete the series. Would like to complete series.     Lab Results  Component Value Date   HGBA1C 5.3 08/07/2024   HGBA1C 6.6 (H) 04/23/2024   Wt Readings from Last 3 Encounters:  01/09/25 261 lb (118.4 kg)  08/07/24 261 lb 12.8 oz (118.8 kg)  06/26/24 264 lb 12.8 oz (120.1 kg)     The following portions of the patient's history were reviewed and updated as  appropriate: past medical history, past surgical history, family history, social history, allergies, medications, and problem list.   Patient Active Problem List   Diagnosis Date Noted   Type 2 diabetes mellitus with hyperglycemia (HCC) 04/29/2024   Hyperlipidemia associated with type 2 diabetes mellitus (HCC) 04/29/2024   Class 3 severe obesity due to excess calories without serious comorbidity with body mass index (BMI) of 40.0 to 44.9 in adult [facility]) 04/23/2024   Past Medical History:  Diagnosis Date   Diabetes mellitus without complication (HCC)    Past Surgical History:  Procedure Laterality Date   LAPAROSCOPIC APPENDECTOMY N/A 03/08/2013   Procedure: APPENDECTOMY LAPAROSCOPIC;  Surgeon: Redell Faith, DO;  Location: [facility];  Service: General;  Laterality: N/A;   Family History  Family history unknown: Yes   Current Medications[1] Allergies[2]   ROS: A complete ROS was performed with pertinent positives/negatives noted in the HPI. The remainder of the ROS are negative.    Objective:   Today's Vitals   01/09/25 0836  BP: 110/60  Pulse: 93  Temp: 98 F (36.7 C)  TempSrc: Temporal  SpO2: 97%  Weight: 261 lb (118.4 kg)  Height: 5' 5 (1.651 m)    GENERAL: Well-appearing, in NAD. Well nourished.  SKIN: Pink, warm and dry.  NECK: Trachea midline. Full ROM w/o pain or tenderness. No lymphadenopathy.  RESPIRATORY: Chest wall symmetrical. Respirations even and non-labored. Breath sounds clear to auscultation bilaterally.  CARDIAC: S1, S2 present, regular rate and rhythm. Peripheral pulses 2+ bilaterally.  EXTREMITIES: Without clubbing, cyanosis,  or edema.  NEUROLOGIC:Steady, even gait.  PSYCH/MENTAL STATUS: Alert, oriented x 3. Cooperative, appropriate mood and affect.   Health Maintenance Due  Topic Date Due   OPHTHALMOLOGY EXAM  Never done   HIV Screening  Never done   Hepatitis C Screening  Never done   HPV VACCINES (3 - Male 3-dose series) 04/03/2025    No  results found for any visits on 01/09/25.  The ASCVD Risk score (Arnett DK, et al., 2019) failed to calculate for the following reasons:   The 2019 ASCVD risk score is only valid for ages 60 to 53     Assessment & Plan:  Assessment and Plan    Type 2 diabetes mellitus with hyperglycemia and associated hyperlipidemia A1c improved from 6.6% to 5.3%. Not taking metformin  regularly. - Ordered A1c test. - Ordered cholesterol test. - Ordered kidney function test. - Encouraged resumption of metformin . - Encouraged healthy diet and regular exercise  Blood in stool Intermittent blood on wiping. Symptoms resolved spontaneously. - Advised increased fiber intake. - Advised adequate hydration. - Recommended Metamucil supplement. - Suggested over-the-counter Colace if straining occurs. - Avoid straining  HPV vaccination (health maintenance) Received one dose in 2019. Discussed importance of completing series to prevent HPV-related conditions. - Administered second dose of HPV vaccine. - Plan for third dose in six months.  HIV and hepatitis C screening (health maintenance) Annual screening discussed and agreed upon. - Ordered HIV and hepatitis C screening.       Orders Placed This Encounter  Procedures   HPV 9-valent vaccine,Recombinat   Hemoglobin A1C   Basic Metabolic Panel (BMET)   Lipid panel   HIV antibody (with reflex)   Hepatitis C antibody   No images are attached to the encounter or orders placed in the encounter. No orders of the defined types were placed in this encounter.   Return in about 6 months (around 07/09/2025) for Diabetes, Cholesterol.   [name] Senters, FNP     [1]  Current Outpatient Medications:    Blood Glucose Monitoring Suppl DEVI, 1 each by Does not apply route in the morning, at noon, and at bedtime. May substitute to any manufacturer covered by patient's insurance., Disp: 1 each, Rfl: 0   Glucose Blood (BLOOD GLUCOSE TEST STRIPS) STRP, Use to check  blood sugar 3 times a day as needed. May substitute to any manufacturer covered by patient's insurance., Disp: 200 strip, Rfl: 3   metFORMIN  (GLUCOPHAGE -XR) 500 MG 24 hr tablet, Take 1 tablet (500 mg total) by mouth daily with breakfast., Disp: 90 tablet, Rfl: 1 [2] No Known Allergies  "

## 2025-01-09 NOTE — Patient Instructions (Signed)
 Blood in stool:  Likely due internal hemorrhoids from constipation  - Avoid straining  - Drink plenty of water  - Increase fiber in diet  - Can use over the counter Colace 1-2 times a day as needed for hard stools

## 2025-01-10 LAB — HIV ANTIBODY (ROUTINE TESTING W REFLEX)
HIV 1&2 Ab, 4th Generation: NONREACTIVE
HIV FINAL INTERPRETATION: NEGATIVE

## 2025-01-10 LAB — HEPATITIS C ANTIBODY: Hepatitis C Ab: NONREACTIVE

## 2025-01-11 ENCOUNTER — Ambulatory Visit: Payer: Self-pay | Admitting: Internal Medicine

## 2025-01-11 ENCOUNTER — Encounter: Payer: Self-pay | Admitting: Internal Medicine

## 2025-01-11 NOTE — Progress Notes (Signed)
 Hi Cory Love,  Your electrolytes and kidney function look great. Your cholesterol has significantly improved, keep up the dietary changes! Also, Your A1C has increased from 5.3% , to now 5.8%. Please remember to take your Metformin . Also, no HIV or Hepatitis C.   [name]

## 2025-07-10 ENCOUNTER — Ambulatory Visit: Admitting: Internal Medicine
